# Patient Record
Sex: Female | Born: 1970 | Race: White | Hispanic: No | Smoking: Current some day smoker
Health system: Southern US, Community
[De-identification: ages and names within clinical notes are randomized; demographics above are authoritative.]

## PROBLEM LIST (undated history)

## (undated) DIAGNOSIS — IMO0002 Reserved for concepts with insufficient information to code with codable children: Secondary | ICD-10-CM

## (undated) DIAGNOSIS — J449 Chronic obstructive pulmonary disease, unspecified: Secondary | ICD-10-CM

## (undated) DIAGNOSIS — M329 Systemic lupus erythematosus, unspecified: Secondary | ICD-10-CM

## (undated) DIAGNOSIS — J45909 Unspecified asthma, uncomplicated: Secondary | ICD-10-CM

## (undated) HISTORY — PX: ABDOMINAL HYSTERECTOMY: SHX81

## (undated) HISTORY — PX: BACK SURGERY: SHX140

---

## 2015-03-01 DIAGNOSIS — K2960 Other gastritis without bleeding: Secondary | ICD-10-CM | POA: Diagnosis present

## 2019-04-10 ENCOUNTER — Other Ambulatory Visit: Payer: Self-pay

## 2019-04-10 ENCOUNTER — Emergency Department
Admission: EM | Admit: 2019-04-10 | Discharge: 2019-04-10 | Disposition: A | Discharge status: Home / Self Care | Payer: Medicare Other | Attending: Emergency Medicine | Admitting: Emergency Medicine

## 2019-04-10 ENCOUNTER — Emergency Department: Payer: Medicare Other

## 2019-04-10 ENCOUNTER — Encounter: Payer: Self-pay | Admitting: Emergency Medicine

## 2019-04-10 DIAGNOSIS — Z5321 Procedure and treatment not carried out due to patient leaving prior to being seen by health care provider: Secondary | ICD-10-CM | POA: Insufficient documentation

## 2019-04-10 DIAGNOSIS — R0602 Shortness of breath: Secondary | ICD-10-CM | POA: Diagnosis present

## 2019-04-10 HISTORY — DX: Chronic obstructive pulmonary disease, unspecified: J44.9

## 2019-04-10 HISTORY — DX: Reserved for concepts with insufficient information to code with codable children: IMO0002

## 2019-04-10 HISTORY — DX: Systemic lupus erythematosus, unspecified: M32.9

## 2019-04-10 HISTORY — DX: Unspecified asthma, uncomplicated: J45.909

## 2019-04-10 NOTE — ED Triage Notes (Signed)
Arrives via [facility].  PER EMS:  Patient began feeling SOB since Monday.  Sent home from work on [employer], waiting to hear back from PCP for appointment, inhaler, nebulizer, singulair without relief and worsening feeling of SOB>  Grandchildren with recent viral illness -- they tested negative for COVID.Marland Kitchen  EMS states patient initially wheezing and sob.  Duoneb given with improvement, cleared wheezing and work of breathing improved.  VS wnl.

## 2019-11-13 ENCOUNTER — Other Ambulatory Visit: Payer: Self-pay

## 2019-11-13 ENCOUNTER — Emergency Department
Admission: EM | Admit: 2019-11-13 | Discharge: 2019-11-14 | Disposition: A | Discharge status: Home / Self Care | Payer: Medicare Other | Attending: Emergency Medicine | Admitting: Emergency Medicine

## 2019-11-13 DIAGNOSIS — J449 Chronic obstructive pulmonary disease, unspecified: Secondary | ICD-10-CM | POA: Insufficient documentation

## 2019-11-13 DIAGNOSIS — T782XXA Anaphylactic shock, unspecified, initial encounter: Secondary | ICD-10-CM | POA: Diagnosis not present

## 2019-11-13 DIAGNOSIS — Z91040 Latex allergy status: Secondary | ICD-10-CM | POA: Insufficient documentation

## 2019-11-13 DIAGNOSIS — Z91010 Allergy to peanuts: Secondary | ICD-10-CM | POA: Diagnosis not present

## 2019-11-13 DIAGNOSIS — F17210 Nicotine dependence, cigarettes, uncomplicated: Secondary | ICD-10-CM | POA: Insufficient documentation

## 2019-11-13 DIAGNOSIS — R42 Dizziness and giddiness: Secondary | ICD-10-CM | POA: Diagnosis present

## 2019-11-13 MED ORDER — DIPHENHYDRAMINE HCL 50 MG/ML IJ SOLN: 50.0000 mg | Freq: Once | INTRAMUSCULAR | Status: DC

## 2019-11-13 MED ORDER — ONDANSETRON HCL 4 MG/2ML IJ SOLN
4.0000 mg | Freq: Once | INTRAMUSCULAR | Status: AC
  Administered 2019-11-13: 4 mg via INTRAVENOUS
  Filled 2019-11-13: qty 2

## 2019-11-13 MED ORDER — METHYLPREDNISOLONE SODIUM SUCC 125 MG IJ SOLR
125.0000 mg | Freq: Once | INTRAMUSCULAR | Status: AC
  Administered 2019-11-13: 125 mg via INTRAVENOUS
  Filled 2019-11-13: qty 2

## 2019-11-13 MED ORDER — IPRATROPIUM-ALBUTEROL 0.5-2.5 (3) MG/3ML IN SOLN
3.0000 mL | Freq: Once | RESPIRATORY_TRACT | Status: AC
  Administered 2019-11-13: 3 mL via RESPIRATORY_TRACT
  Filled 2019-11-13: qty 3

## 2019-11-13 NOTE — ED Triage Notes (Addendum)
Patient coming [facility] from home for near syncope and allergic reaction. Patient allergic to pickles and peanuts - patient unknowingly ate pickles on sandwich from restaurant at dinner this evening.   Patient had rash/hives during EMS transport that have now resolved.   Patient c/o SOB, chest discomfort, cough and nausea.   Patient given 4 mg zofran, epipen IM, pepcid IV, 4 baby aspirin, 50 mg benadryl IV, 1 duoneb, and 400 mL NaCl in transport.

## 2019-11-13 NOTE — ED Notes (Signed)
ED provider at bedside.

## 2019-11-13 NOTE — ED Provider Notes (Signed)
[facility] Emergency Department Provider Note  ____________________________________________  Time seen: Approximately 11:38 PM  I have reviewed the triage vital signs and the nursing notes.   HISTORY  Chief Complaint Allergic Reaction   HPI Grace King is a 49 y.o. female with a history of asthma, COPD, lupus who presents for evaluation after anaphylaxis.  Patient reports having allergies to cucumbers and pickles.  She went out to eat this evening.  She had a sandwich that mistakenly had pickles inside.  When she tasted she tried to spit it out but she felt like she swallowed some of it.  30 minutes after that she started feeling dizzy, chest tightness, shortness of breath.  She felt like she was going to pass out.  She lost balance and fell backwards hitting her head on the wall.  No LOC.  She is not on blood thinners.  She also had nausea but no vomiting or diarrhea.  She describes a diffuse pruritic rash.  No angioedema, no throat closing sensation.  When EMS arrived, they gave her an EpiPen, 4 mg of IV Zofran, IV Pepcid, aspirin, 50 mg of Benadryl, 1 DuoNeb, and 400 cc of fluids.   After all those medications were given, patient now complaining only of nausea and still itchy with a rash.  Denies chest pain or shortness of breath, throat closing sensation, angioedema.  Past Medical History:  Diagnosis Date  . Asthma   . COPD (chronic obstructive pulmonary disease) (HCC)   . Lupus [facility])     Past Surgical History:  Procedure Laterality Date  . ABDOMINAL HYSTERECTOMY    . BACK SURGERY      Prior to Admission medications   Medication Sig Start Date End Date Taking? Authorizing Provider  EPINEPHrine 0.3 mg/0.3 mL IJ SOAJ injection Inject 0.3 mLs (0.3 mg total) into the muscle as needed for anaphylaxis. 11/14/19   Nita Sickle, MD  famotidine (PEPCID) 20 MG tablet Take 1 tablet (20 mg total) by mouth daily for 7 days. 11/14/19 11/21/19  Nita Sickle, MD  predniSONE (DELTASONE) 20 MG tablet Take 3 tablets (60 mg total) by mouth daily for 4 days. 11/14/19 11/18/19  Nita Sickle, MD    Allergies Erythromycin, Latex, Other, Peanut-containing drug products, and Penicillins  No family history on file.  Social History Social History   Tobacco Use  . Smoking status: Current Some Day Smoker    Last attempt to quit: 04/09/2017    Years since quitting: 2.6  . Smokeless tobacco: Never Used  Substance Use Topics  . Alcohol use: Never  . Drug use: Not on file    Review of Systems  Constitutional: Negative for fever. + Lightheadedness Eyes: Negative for visual changes. ENT: Negative for sore throat. Neck: No neck pain  Cardiovascular: Negative for chest pain. Respiratory: + shortness of breath. Gastrointestinal: Negative for abdominal pain, vomiting or diarrhea. + nausea Genitourinary: Negative for dysuria. Musculoskeletal: Negative for back pain. Skin: + rash. Neurological: Negative for headaches, weakness or numbness. Psych: No SI or HI  ____________________________________________   PHYSICAL EXAM:  VITAL SIGNS: ED Triage Vitals  Enc Vitals Group     BP 11/13/19 2301 (!) 158/85     Pulse Rate 11/13/19 2301 (!) 104     Resp 11/13/19 2301 18     Temp 11/13/19 2301 99.9 F (37.7 C)     Temp src --      SpO2 11/13/19 2301 95 %     Weight 11/13/19 2302  112 lb (50.8 kg)     Height 11/13/19 2302 5\' 5"  (1.651 m)     Head Circumference --      Peak Flow --      Pain Score 11/13/19 2302 7     Pain Loc --      Pain Edu? --      Excl. in GC? --     Constitutional: Alert and oriented. Well appearing and in no apparent distress. HEENT:      Head: Normocephalic and atraumatic.         Eyes: Conjunctivae are normal. Sclera is non-icteric.       Mouth/Throat: Mucous membranes are moist.  No angioedema, uvula and tongue are normal with no swelling, no stridor      Neck: Supple with no signs of meningismus.  No  C-spine tenderness Cardiovascular: Tachycardic with regular rhythm Respiratory: Normal respiratory effort.  Significantly decreased air movement bilaterally with no wheezing Gastrointestinal: Soft, non tender. Musculoskeletal: Nontender with normal range of motion in all extremities. No edema, cyanosis, or erythema of extremities.  No T and L-spine tenderness. Neurologic: Normal speech and language. Face is symmetric. Moving all extremities. No gross focal neurologic deficits are appreciated. Skin: Skin is warm, dry and intact.  Minimal blanching erythematous rash on her upper extremities. Psychiatric: Mood and affect are normal. Speech and behavior are normal.  ____________________________________________   LABS (all labs ordered are listed, but only abnormal results are displayed)  Labs Reviewed - No data to display ____________________________________________  EKG  none  ____________________________________________  RADIOLOGY  I have personally reviewed the images performed during this visit and I agree with the Radiologist's read.   Interpretation by Radiologist:  CT Head Wo Contrast  Result Date: 11/14/2019 CLINICAL DATA:  Head trauma EXAM: CT HEAD WITHOUT CONTRAST TECHNIQUE: Contiguous axial images were obtained from the base of the skull through the vertex without intravenous contrast. COMPARISON:  None. FINDINGS: Brain: There is no mass, hemorrhage or extra-axial collection. The size and configuration of the ventricles and extra-axial CSF spaces are normal. The brain parenchyma is normal, without acute or chronic infarction. Vascular: No abnormal hyperdensity of the major intracranial arteries or dural venous sinuses. No intracranial atherosclerosis. Skull: The visualized skull base, calvarium and extracranial soft tissues are normal. Sinuses/Orbits: No fluid levels or advanced mucosal thickening of the visualized paranasal sinuses. No mastoid or middle ear effusion. The orbits  are normal. IMPRESSION: Normal head CT. Electronically Signed   By: 01/14/2020 M.D.   On: 11/14/2019 00:52      ____________________________________________   PROCEDURES  Procedure(s) performed:yes .1-3 Lead EKG Interpretation Performed by: 01/14/2020, MD Authorized by: Nita Sickle, MD     Interpretation: non-specific     ECG rate assessment: tachycardic     Rhythm: sinus tachycardia     Ectopy: none     Critical Care performed:  None ____________________________________________   INITIAL IMPRESSION / ASSESSMENT AND PLAN / ED COURSE   49 y.o. female with a history of asthma, COPD, lupus who presents for evaluation after anaphylaxis after accidentally eating pickles and a sandwich this evening.  Patient with rash, nausea, dizziness, and shortness of breath.  Received several medications per EMS and at this time is only complained of nausea and rash.  No chest pain or shortness of breath.  She is well-appearing with no signs of angioedema, no stridor, airways patent.  She is slightly tachycardic after receiving 1 DuoNeb and an EpiPen.  She has a mild  pruritic rash and decreased air movement bilaterally.  Will treat with duo neb.  Patient has already received IV Pepcid, EpiPen and IV Benadryl.  Will give IV steroids.  Will monitor closely for any recurrence of her symptoms.  Old medical records reviewed.  Patient placed on telemetry for close monitoring  _________________________ 2:42 AM on 11/14/2019 -----------------------------------------  Patient monitored for 4 hours post EpiPen with no recurrence of her symptoms.  Discussed indications and how to use an EpiPen.  Patient provided with a prescription.  Recommend to carry 1 with her at all times.  Will also prescribe Pepcid and steroids.  Discussed my standard return precautions and follow-up with her primary care doctor.  CT head was visualized by me with no evidence of intracranial injury, confirmed by  radiology.    _____________________________________________ Please note:  Patient was evaluated in Emergency Department today for the symptoms described in the history of present illness. Patient was evaluated in the context of the global COVID-19 pandemic, which necessitated consideration that the patient might be at risk for infection with the SARS-CoV-2 virus that causes COVID-19. Institutional protocols and algorithms that pertain to the evaluation of patients at risk for COVID-19 are in a state of rapid change based on information released by regulatory bodies including the CDC and federal and state organizations. These policies and algorithms were followed during the patient's care in the ED.  Some ED evaluations and interventions may be delayed as a result of limited staffing during the pandemic.   [place] Controlled Sub[place] reviewed by me. ____________________________________________   FINAL CLINICAL IMPRESSION(S) / ED DIAGNOSES   Final diagnoses:  Anaphylaxis, initial encounter      NEW MEDICATIONS STARTED DURING THIS VISIT:  ED Discharge Orders         Ordered    EPINEPHrine 0.3 mg/0.3 mL IJ SOAJ injection  As needed     Discontinue  Reprint     11/14/19 0242    famotidine (PEPCID) 20 MG tablet  Daily     Discontinue  Reprint     11/14/19 0242    predniSONE (DELTASONE) 20 MG tablet  Daily     Discontinue  Reprint     11/14/19 0242           Note:  This document was prepared using Dragon voice recognition software and may include unintentional dictation errors.    Alfred Levins, [place], MD 11/14/19 [phone]

## 2019-11-14 ENCOUNTER — Emergency Department: Payer: Medicare Other

## 2019-11-14 DIAGNOSIS — T782XXA Anaphylactic shock, unspecified, initial encounter: Secondary | ICD-10-CM | POA: Diagnosis not present

## 2019-11-14 MED ORDER — FAMOTIDINE 20 MG PO TABS: 20.0000 mg | ORAL_TABLET | Freq: Every day | ORAL | 0 refills | Status: DC

## 2019-11-14 MED ORDER — EPINEPHRINE 0.3 MG/0.3ML IJ SOAJ: 0.3000 mg | INTRAMUSCULAR | 1 refills | Status: DC | PRN

## 2019-11-14 MED ORDER — PREDNISONE 20 MG PO TABS: 60.0000 mg | ORAL_TABLET | Freq: Every day | ORAL | 0 refills | Status: AC

## 2019-11-14 NOTE — ED Notes (Signed)
ED provider at bedside.

## 2019-11-14 NOTE — ED Notes (Signed)
Reviewed discharge instructions, follow-up care, and prescriptions with patient. Patient verbalized understanding of all information reviewed. Patient stable, with no distress noted at this time.    

## 2019-12-28 ENCOUNTER — Ambulatory Visit
Admission: EM | Admit: 2019-12-28 | Discharge: 2019-12-28 | Disposition: A | Discharge status: Home / Self Care | Payer: Medicare Other | Attending: Family Medicine | Admitting: Family Medicine

## 2019-12-28 ENCOUNTER — Other Ambulatory Visit: Payer: Self-pay

## 2019-12-28 ENCOUNTER — Encounter: Payer: Self-pay | Admitting: Emergency Medicine

## 2019-12-28 DIAGNOSIS — R202 Paresthesia of skin: Secondary | ICD-10-CM

## 2019-12-28 DIAGNOSIS — S29012A Strain of muscle and tendon of back wall of thorax, initial encounter: Secondary | ICD-10-CM

## 2019-12-28 MED ORDER — PREDNISONE 10 MG PO TABS: ORAL_TABLET | ORAL | 0 refills | Status: DC

## 2019-12-28 MED ORDER — CYCLOBENZAPRINE HCL 10 MG PO TABS: 10.0000 mg | ORAL_TABLET | Freq: Three times a day (TID) | ORAL | 0 refills | Status: DC | PRN

## 2019-12-28 NOTE — Discharge Instructions (Signed)
Heat/ice, tylenol

## 2019-12-28 NOTE — ED Provider Notes (Signed)
[facility]    CSN: [id number] Arrival date & time: 12/28/19  1421      History   Chief Complaint Chief Complaint  Patient presents with  . Shoulder Pain  . Arm Pain  . Hand Pain    HPI Grace King is a 49 y.o. female.   49 yo female with a c/o left shoulder, arm and upper back pains for the past week, associated with occasional burning and tingling. Denies any falls or other traumatic injury. States up until last week she was doing work involving lifting and over head reaching. Denies any facial or lower extremity numbness, speech or swallowing problems.      Past Medical History:  Diagnosis Date  . Asthma   . COPD (chronic obstructive pulmonary disease) (HCC)   . Lupus (HCC)     There are no problems to display for this patient.   Past Surgical History:  Procedure Laterality Date  . ABDOMINAL HYSTERECTOMY    . BACK SURGERY      OB History   No obstetric history on file.      Home Medications    Prior to Admission medications   Medication Sig Start Date End Date Taking? Authorizing Provider  albuterol (PROVENTIL) (2.5 MG/3ML) 0.083% nebulizer solution Inhale into the lungs. 08/18/19 08/17/20 Yes [provider]  budesonide-formoterol (SYMBICORT) 160-4.5 MCG/ACT inhaler Inhale into the lungs. 08/18/19 08/17/20 Yes [provider]  diclofenac (VOLTAREN) 50 MG EC tablet Take by mouth. 06/02/19  Yes [provider]  estradiol (CLIMARA - DOSED IN MG/24 HR) 0.05 mg/24hr patch Place onto the skin. 10/02/18  Yes [provider]  montelukast (SINGULAIR) 10 MG tablet Take by mouth. 08/18/19 08/17/20 Yes [provider]  omeprazole (PRILOSEC) 40 MG capsule Take by mouth. 08/18/19 08/17/20 Yes [provider]  oxyCODONE-acetaminophen (PERCOCET/ROXICET) 5-325 MG tablet Take by mouth. 12/14/19 01/13/20 Yes [provider]  senna (SENOKOT) 8.6 MG tablet Take 1 tablet by mouth daily. 03/03/19 03/02/20 Yes  [provider]  traZODone (DESYREL) 100 MG tablet Take by mouth. 12/01/18  Yes [provider]  cyclobenzaprine (FLEXERIL) 10 MG tablet Take 1 tablet (10 mg total) by mouth 3 (three) times daily as needed for muscle spasms. 12/28/19   Payton Mccallum, MD  EPINEPHrine 0.3 mg/0.3 mL IJ SOAJ injection Inject 0.3 mLs (0.3 mg total) into the muscle as needed for anaphylaxis. 11/14/19   Nita Sickle, MD  famotidine (PEPCID) 20 MG tablet Take 1 tablet (20 mg total) by mouth daily for 7 days. 11/14/19 11/21/19  Nita Sickle, MD  predniSONE (DELTASONE) 10 MG tablet Start 60 mg po day one, then 50 mg po day two, taper by 10 mg daily until complete. 12/28/19   Payton Mccallum, MD    Family History History reviewed. No pertinent family history.  Social History Social History   Tobacco Use  . Smoking status: Current Some Day Smoker    Last attempt to quit: 04/09/2017    Years since quitting: 2.7  . Smokeless tobacco: Never Used  Substance Use Topics  . Alcohol use: Never  . Drug use: Never     Allergies   Erythromycin, Latex, Other, Peanut-containing drug products, and Penicillins   Review of Systems Review of Systems   Physical Exam Triage Vital Signs ED Triage Vitals  Enc Vitals Group     BP 12/28/19 1538 (!) 153/87     Pulse Rate 12/28/19 1538 71     Resp 12/28/19 1538 18  Temp 12/28/19 1538 98.6 F (37 C)     Temp Source 12/28/19 1538 Oral     SpO2 12/28/19 1538 97 %     Weight 12/28/19 1535 112 lb (50.8 kg)     Height 12/28/19 1535 5\' 5"  (1.651 m)     Head Circumference --      Peak Flow --      Pain Score 12/28/19 1535 9     Pain Loc --      Pain Edu? --      Excl. in GC? --    No data found.  Updated Vital Signs BP (!) 153/87 (BP Location: Right Arm)   Pulse 71   Temp 98.6 F (37 C) (Oral)   Resp 18   Ht 5\' 5"  (1.651 m)   Wt 50.8 kg   SpO2 97%   BMI 18.64 kg/m   Visual Acuity Right Eye Distance:   Left Eye Distance:   Bilateral  Distance:    Right Eye Near:   Left Eye Near:    Bilateral Near:     Physical Exam Vitals and nursing note reviewed.  Constitutional:      General: She is not in acute distress.    Appearance: She is not toxic-appearing or diaphoretic.  Musculoskeletal:     Left shoulder: Tenderness (over the deltoid) present. No swelling, deformity, effusion, laceration, bony tenderness or crepitus. Normal range of motion. Normal strength. Normal pulse.     Thoracic back: Spasms and tenderness (over the right deltoid muscle) present. No swelling, edema, deformity, signs of trauma, lacerations or bony tenderness. Normal range of motion. No scoliosis.     Comments: Left upper extremity neurovascularly intact; strength 5/5 UE equal bilaterally  Neurological:     Mental Status: She is alert.      UC Treatments / Results  Labs (all labs ordered are listed, but only abnormal results are displayed) Labs Reviewed - No data to display  EKG   Radiology No results found.  Procedures Procedures (including critical care time)  Medications Ordered in UC Medications - No data to display  Initial Impression / Assessment and Plan / UC Course  I have reviewed the triage vital signs and the nursing notes.  Pertinent labs & imaging results that were available during my care of the patient were reviewed by me and considered in my medical decision making (see chart for details).      Final Clinical Impressions(s) / UC Diagnoses   Final diagnoses:  Muscle strain of left upper back, initial encounter  Paresthesia of arm     Discharge Instructions     Heat/ice, tylenol    ED Prescriptions    Medication Sig Dispense Auth. Provider   predniSONE (DELTASONE) 10 MG tablet Start 60 mg po day one, then 50 mg po day two, taper by 10 mg daily until complete. 21 tablet 12/30/19, MD   cyclobenzaprine (FLEXERIL) 10 MG tablet Take 1 tablet (10 mg total) by mouth 3 (three) times daily as needed for  muscle spasms. 30 tablet , MD      1. diagnosis reviewed with patient 2. rx as per orders above; reviewed possible side effects, interactions, risks and benefits  3. Recommend supportive treatment as above 4. Follow-up prn if symptoms worsen or don't improve   PDMP not reviewed this encounter.   Payton Mccallum, MD 12/28/19 [phone]

## 2019-12-28 NOTE — ED Triage Notes (Signed)
Patient c/o left shoulder, arm and pain that started 1 week ago. She states he is having burning and tingling in her should. Also reports that 3 of her fingers are numb. Denies injury.

## 2020-02-27 ENCOUNTER — Other Ambulatory Visit: Payer: Self-pay

## 2020-02-27 ENCOUNTER — Emergency Department: Payer: Medicare Other

## 2020-02-27 ENCOUNTER — Inpatient Hospital Stay
Admission: EM | Admit: 2020-02-27 | Discharge: 2020-02-29 | DRG: 190 | Disposition: A | Discharge status: Home / Self Care | Payer: Medicare Other | Attending: Internal Medicine | Admitting: Internal Medicine

## 2020-02-27 DIAGNOSIS — D696 Thrombocytopenia, unspecified: Secondary | ICD-10-CM | POA: Diagnosis present

## 2020-02-27 DIAGNOSIS — M329 Systemic lupus erythematosus, unspecified: Secondary | ICD-10-CM | POA: Diagnosis present

## 2020-02-27 DIAGNOSIS — E785 Hyperlipidemia, unspecified: Secondary | ICD-10-CM | POA: Diagnosis present

## 2020-02-27 DIAGNOSIS — J9601 Acute respiratory failure with hypoxia: Secondary | ICD-10-CM | POA: Diagnosis present

## 2020-02-27 DIAGNOSIS — Z79890 Hormone replacement therapy (postmenopausal): Secondary | ICD-10-CM

## 2020-02-27 DIAGNOSIS — Z91010 Allergy to peanuts: Secondary | ICD-10-CM

## 2020-02-27 DIAGNOSIS — R0602 Shortness of breath: Secondary | ICD-10-CM | POA: Diagnosis not present

## 2020-02-27 DIAGNOSIS — K219 Gastro-esophageal reflux disease without esophagitis: Secondary | ICD-10-CM | POA: Diagnosis present

## 2020-02-27 DIAGNOSIS — M48061 Spinal stenosis, lumbar region without neurogenic claudication: Secondary | ICD-10-CM | POA: Diagnosis present

## 2020-02-27 DIAGNOSIS — J441 Chronic obstructive pulmonary disease with (acute) exacerbation: Principal | ICD-10-CM | POA: Diagnosis present

## 2020-02-27 DIAGNOSIS — J440 Chronic obstructive pulmonary disease with acute lower respiratory infection: Secondary | ICD-10-CM | POA: Diagnosis present

## 2020-02-27 DIAGNOSIS — F17210 Nicotine dependence, cigarettes, uncomplicated: Secondary | ICD-10-CM | POA: Diagnosis present

## 2020-02-27 DIAGNOSIS — Z79899 Other long term (current) drug therapy: Secondary | ICD-10-CM

## 2020-02-27 DIAGNOSIS — R739 Hyperglycemia, unspecified: Secondary | ICD-10-CM | POA: Diagnosis present

## 2020-02-27 DIAGNOSIS — Z79891 Long term (current) use of opiate analgesic: Secondary | ICD-10-CM

## 2020-02-27 DIAGNOSIS — J209 Acute bronchitis, unspecified: Secondary | ICD-10-CM | POA: Diagnosis present

## 2020-02-27 DIAGNOSIS — M5417 Radiculopathy, lumbosacral region: Secondary | ICD-10-CM | POA: Diagnosis present

## 2020-02-27 DIAGNOSIS — Z880 Allergy status to penicillin: Secondary | ICD-10-CM

## 2020-02-27 DIAGNOSIS — K2960 Other gastritis without bleeding: Secondary | ICD-10-CM | POA: Diagnosis present

## 2020-02-27 DIAGNOSIS — Z7951 Long term (current) use of inhaled steroids: Secondary | ICD-10-CM

## 2020-02-27 DIAGNOSIS — Z20822 Contact with and (suspected) exposure to covid-19: Secondary | ICD-10-CM | POA: Diagnosis present

## 2020-02-27 DIAGNOSIS — Z881 Allergy status to other antibiotic agents status: Secondary | ICD-10-CM

## 2020-02-27 DIAGNOSIS — Z90710 Acquired absence of both cervix and uterus: Secondary | ICD-10-CM

## 2020-02-27 DIAGNOSIS — Z91040 Latex allergy status: Secondary | ICD-10-CM

## 2020-02-27 DIAGNOSIS — K5909 Other constipation: Secondary | ICD-10-CM | POA: Diagnosis present

## 2020-02-27 LAB — BASIC METABOLIC PANEL
Anion gap: 12 (ref 5–15)
BUN: 9 mg/dL (ref 6–20)
CO2: 24 mmol/L (ref 22–32)
Calcium: 9.6 mg/dL (ref 8.9–10.3)
Chloride: 104 mmol/L (ref 98–111)
Creatinine, Ser: 0.7 mg/dL (ref 0.44–1.00)
GFR calc Af Amer: >60 mL/min (ref >60)
GFR calc non Af Amer: >60 mL/min (ref >60)
Glucose, Bld: 97 mg/dL (ref 70–99)
Potassium: 4.9 mmol/L (ref 3.5–5.1)
Sodium: 140 mmol/L (ref 135–145)

## 2020-02-27 LAB — CBC
HCT: 43.7 % (ref 36.0–46.0)
Hemoglobin: 15.1 g/dL — ABNORMAL HIGH (ref 12.0–15.0)
MCH: 31.3 pg (ref 26.0–34.0)
MCHC: 34.6 g/dL (ref 30.0–36.0)
MCV: 90.7 fL (ref 80.0–100.0)
Platelets: 165 10*3/uL (ref 150–400)
RBC: 4.82 MIL/uL (ref 3.87–5.11)
RDW: 12.8 % (ref 11.5–15.5)
WBC: 5.4 10*3/uL (ref 4.0–10.5)
nRBC: 0 % (ref 0.0–0.2)

## 2020-02-27 LAB — TROPONIN I (HIGH SENSITIVITY)
Troponin I (High Sensitivity): 3 ng/L (ref <18)
Troponin I (High Sensitivity): 4 ng/L (ref <18)

## 2020-02-27 MED ORDER — METHYLPREDNISOLONE SODIUM SUCC 125 MG IJ SOLR
125.0000 mg | Freq: Once | INTRAMUSCULAR | Status: AC
  Administered 2020-02-28: 125 mg via INTRAVENOUS
  Filled 2020-02-27: qty 2

## 2020-02-27 MED ORDER — IPRATROPIUM-ALBUTEROL 0.5-2.5 (3) MG/3ML IN SOLN
3.0000 mL | Freq: Once | RESPIRATORY_TRACT | Status: AC
  Administered 2020-02-28: 3 mL via RESPIRATORY_TRACT
  Filled 2020-02-27: qty 3

## 2020-02-27 MED ORDER — METHYLPREDNISOLONE SODIUM SUCC 125 MG IJ SOLR: 125.0000 mg | Freq: Once | INTRAMUSCULAR | Status: DC

## 2020-02-27 MED ORDER — IPRATROPIUM-ALBUTEROL 0.5-2.5 (3) MG/3ML IN SOLN
3.0000 mL | Freq: Once | RESPIRATORY_TRACT | Status: AC
  Administered 2020-02-27: 3 mL via RESPIRATORY_TRACT
  Filled 2020-02-27: qty 3

## 2020-02-27 MED ORDER — MAGNESIUM SULFATE 2 GM/50ML IV SOLN
2.0000 g | Freq: Once | INTRAVENOUS | Status: AC
  Administered 2020-02-28: 2 g via INTRAVENOUS
  Filled 2020-02-27: qty 50

## 2020-02-27 MED ORDER — PREDNISONE 20 MG PO TABS
60.0000 mg | ORAL_TABLET | Freq: Once | ORAL | Status: AC
  Administered 2020-02-27: 60 mg via ORAL
  Filled 2020-02-27: qty 3

## 2020-02-27 NOTE — ED Triage Notes (Signed)
Pt comes POV with sob for 4 days. Hx of bronchitis and copd flair ups with weather changes. Pt is a smoker.

## 2020-02-27 NOTE — ED Notes (Signed)
Unable to obtain IV for steroids. attemtped x2. Breathing treatment administered per jessup

## 2020-02-27 NOTE — ED Provider Notes (Signed)
[facility] Emergency Department Provider Note   ____________________________________________   First MD Initiated Contact with Patient 02/27/20 2337     (approximate)  I have reviewed the triage vital signs and the nursing notes.   HISTORY  Chief Complaint Shortness of Breath    HPI Grace King is a 49 y.o. female who presents to the ED from home with a chief complaint of shortness of breath.  Patient has a history of COPD not on chronic oxygen who reports a 4-day history of cough and shortness of breath.  Attributes it to the weather.  Denies fever, chest pain, abdominal pain, nausea, vomiting or diarrhea.  Denies sick contacts.       Past Medical History:  Diagnosis Date  . Asthma   . COPD (chronic obstructive pulmonary disease) (HCC)   . Lupus [facility])     Patient Active Problem List   Diagnosis Date Noted  . COPD exacerbation (HCC) 02/28/2020    Past Surgical History:  Procedure Laterality Date  . ABDOMINAL HYSTERECTOMY    . BACK SURGERY      Prior to Admission medications   Medication Sig Start Date End Date Taking? Authorizing Provider  albuterol (PROVENTIL) (2.5 MG/3ML) 0.083% nebulizer solution Inhale 2.5 mg into the lungs every 4 (four) hours as needed.  08/18/19 08/17/20 Yes [provider]  budesonide-formoterol (SYMBICORT) 160-4.5 MCG/ACT inhaler Inhale 2 puffs into the lungs daily.  08/18/19 08/17/20 Yes [provider]  cyclobenzaprine (FLEXERIL) 10 MG tablet Take 1 tablet (10 mg total) by mouth 3 (three) times daily as needed for muscle spasms. 12/28/19  Yes Conty, Pamala Hurry, MD  EPINEPHrine 0.3 mg/0.3 mL IJ SOAJ injection Inject 0.3 mLs (0.3 mg total) into the muscle as needed for anaphylaxis. 11/14/19  Yes Don Perking, [place], MD  famotidine (PEPCID) 20 MG tablet Take 1 tablet (20 mg total) by mouth daily for 7 days. 11/14/19 02/28/20 Yes Veronese, [place], MD  montelukast (SINGULAIR) 10 MG tablet Take 10 mg by mouth at  bedtime.  08/18/19 08/17/20 Yes [provider]  omeprazole (PRILOSEC) 40 MG capsule Take 40 mg by mouth daily.  08/18/19 08/17/20 Yes [provider]  oxyCODONE-acetaminophen (PERCOCET/ROXICET) 5-325 MG tablet Take 1 tablet by mouth every 6 (six) hours as needed. 02/12/20 03/13/20 Yes [provider]  diclofenac (VOLTAREN) 50 MG EC tablet Take by mouth. Patient not taking: Reported on 02/28/2020 06/02/19   [provider]  estradiol (CLIMARA - DOSED IN MG/24 HR) 0.05 mg/24hr patch Place onto the skin. Patient not taking: Reported on 02/28/2020 10/02/18   [provider]  predniSONE (DELTASONE) 10 MG tablet Start 60 mg po day one, then 50 mg po day two, taper by 10 mg daily until complete. Patient not taking: Reported on 02/28/2020 12/28/19   Payton Mccallum, MD  senna (SENOKOT) 8.6 MG tablet Take 1 tablet by mouth daily. Patient not taking: Reported on 02/28/2020 03/03/19 03/02/20  [provider]  traZODone (DESYREL) 100 MG tablet Take by mouth. Patient not taking: Reported on 02/28/2020 12/01/18   [provider]    Allergies Erythromycin, Latex, Other, Peanut-containing drug products, and Penicillins  History reviewed. No pertinent family history.  Social History Social History   Tobacco Use  . Smoking status: Current Some Day Smoker    Last attempt to quit: 04/09/2017    Years since quitting: 2.8  . Smokeless tobacco: Never Used  Substance Use Topics  . Alcohol use: Never  . Drug use: Never  Review of Systems  Constitutional: No fever/chills Eyes: No visual changes. ENT: No sore throat. Cardiovascular: Denies chest pain. Respiratory: Positive for cough and shortness of breath. Gastrointestinal: No abdominal pain.  No nausea, no vomiting.  No diarrhea.  No constipation. Genitourinary: Negative for dysuria. Musculoskeletal: Negative for back pain. Skin: Negative for rash. Neurological: Negative for headaches, focal  weakness or numbness.   ____________________________________________   PHYSICAL EXAM:  VITAL SIGNS: ED Triage Vitals [02/27/20 1704]  Enc Vitals Group     BP (!) 166/102     Pulse Rate 98     Resp (!) 25     Temp 98.3 F (36.8 C)     Temp Source Oral     SpO2 100 %     Weight 118 lb (53.5 kg)     Height 5\' 7"  (1.702 m)     Head Circumference      Peak Flow      Pain Score 0     Pain Loc      Pain Edu?      Excl. in GC?     Constitutional: Alert and oriented.  Chronically ill appearing and in moderate acute distress. Eyes: Conjunctivae are normal. PERRL. EOMI. Head: Atraumatic. Nose: No congestion/rhinnorhea. Mouth/Throat: Mucous membranes are mildly dry.   Neck: No stridor.   Cardiovascular: Normal rate, regular rhythm. Grossly normal heart sounds.  Good peripheral circulation. Respiratory: Increased respiratory effort.  Mild retractions. Lungs with scattered rhonchi and wheezing. Gastrointestinal: Soft and nontender. No distention. No abdominal bruits. No CVA tenderness. Musculoskeletal: No lower extremity tenderness nor edema.  No joint effusions. Neurologic:  Normal speech and language. No gross focal neurologic deficits are appreciated.  Skin:  Skin is warm, dry and intact. No rash noted. Psychiatric: Mood and affect are normal. Speech and behavior are normal.  ____________________________________________   LABS (all labs ordered are listed, but only abnormal results are displayed)  Labs Reviewed  CBC - Abnormal; Notable for the following components:      Result Value   Hemoglobin 15.1 (*)    All other components within normal limits  RESPIRATORY PANEL BY RT PCR (FLU A&B, COVID)  EXPECTORATED SPUTUM ASSESSMENT W REFEX TO RESP CULTURE  BASIC METABOLIC PANEL  HIV ANTIBODY (ROUTINE TESTING W REFLEX)  BASIC METABOLIC PANEL  CBC  POC URINE PREG, ED  TROPONIN I (HIGH SENSITIVITY)  TROPONIN I (HIGH SENSITIVITY)    ____________________________________________  EKG  ED ECG REPORT I, Yang Rack J, the attending physician, personally viewed and interpreted this ECG.   Date: 02/28/2020  EKG Time: 1704  Rate: 100  Rhythm: normal EKG, normal sinus rhythm  Axis: Normal  Intervals:none  ST&T Change: Nonspecific  ____________________________________________  RADIOLOGY  ED MD interpretation: COPD  Official radiology report(s): DG Chest 2 View  Result Date: 02/27/2020 CLINICAL DATA:  Shortness of breath.  History of COPD. EXAM: CHEST - 2 VIEW COMPARISON:  04/10/2019 FINDINGS: Lungs are chronically hyperinflated with upper lobe predominant emphysema. Again seen bulla in the right upper lobe. Mild biapical pleuroparenchymal scarring. The heart is normal in size. Normal mediastinal contours. No focal airspace disease, pleural effusion, pulmonary edema, or pneumothorax. No acute osseous abnormalities are seen. IMPRESSION: 1. No acute abnormality. 2. Chronic hyperinflation and emphysema consistent with COPD, unchanged from November 2020. Electronically Signed   By: December 2020 M.D.   On: 02/27/2020 18:17    ____________________________________________   PROCEDURES  Procedure(s) performed (including Critical Care):  .1-3 Lead EKG Interpretation Performed by: 02/29/2020  J, MD Authorized by: Irean Hong, MD     Interpretation: normal     ECG rate:  98   ECG rate assessment: normal     Rhythm: sinus rhythm     Ectopy: none     Conduction: normal   Comments:     Patient placed on cardiac monitor to evaluate for arrhythmia     ____________________________________________   INITIAL IMPRESSION / ASSESSMENT AND PLAN / ED COURSE  As part of my medical decision making, I reviewed the following data within the electronic MEDICAL RECORD NUMBER Nursing notes reviewed and incorporated, Labs reviewed, EKG interpreted, Old chart reviewed, Radiograph reviewed, Discussed with admitting physician and  Notes from prior ED visits        49 year old female with COPD presenting with exacerbation. Differential includes, but is not limited to, viral syndrome, bronchitis including COPD exacerbation, pneumonia, reactive airway disease including asthma, CHF including exacerbation with or without pulmonary/interstitial edema, pneumothorax, ACS, thoracic trauma, and pulmonary embolism.  Laboratory and imaging results unremarkable.  Covid swab pending.  Patient received oral prednisone and 2 duo nebs prior to being roomed.  She is tachypneic with retractions and rhonchi/wheezing.  Will administer IV Solu-Medrol, IV magnesium, another DuoNeb.  Will discuss with hospitalist services for admission.      ____________________________________________   FINAL CLINICAL IMPRESSION(S) / ED DIAGNOSES  Final diagnoses:  COPD exacerbation [facility])     ED Discharge Orders    None      *Please note:  Munirah Noto was evaluated in Emergency Department on 02/28/2020 for the symptoms described in the history of present illness. She was evaluated in the context of the global COVID-19 pandemic, which necessitated consideration that the patient might be at risk for infection with the SARS-CoV-2 virus that causes COVID-19. Institutional protocols and algorithms that pertain to the evaluation of patients at risk for COVID-19 are in a state of rapid change based on information released by regulatory bodies including the CDC and federal and state organizations. These policies and algorithms were followed during the patient's care in the ED.  Some ED evaluations and interventions may be delayed as a result of limited staffing during and the pandemic.*   Note:  This document was prepared using Dragon voice recognition software and may include unintentional dictation errors.   Irean Hong, MD 02/28/20 [phone]

## 2020-02-28 DIAGNOSIS — K2960 Other gastritis without bleeding: Secondary | ICD-10-CM | POA: Diagnosis present

## 2020-02-28 DIAGNOSIS — Z881 Allergy status to other antibiotic agents status: Secondary | ICD-10-CM | POA: Diagnosis not present

## 2020-02-28 DIAGNOSIS — K219 Gastro-esophageal reflux disease without esophagitis: Secondary | ICD-10-CM

## 2020-02-28 DIAGNOSIS — R739 Hyperglycemia, unspecified: Secondary | ICD-10-CM

## 2020-02-28 DIAGNOSIS — Z90710 Acquired absence of both cervix and uterus: Secondary | ICD-10-CM | POA: Diagnosis not present

## 2020-02-28 DIAGNOSIS — M48061 Spinal stenosis, lumbar region without neurogenic claudication: Secondary | ICD-10-CM | POA: Diagnosis present

## 2020-02-28 DIAGNOSIS — J441 Chronic obstructive pulmonary disease with (acute) exacerbation: Principal | ICD-10-CM

## 2020-02-28 DIAGNOSIS — K5909 Other constipation: Secondary | ICD-10-CM | POA: Diagnosis present

## 2020-02-28 DIAGNOSIS — Z91010 Allergy to peanuts: Secondary | ICD-10-CM | POA: Diagnosis not present

## 2020-02-28 DIAGNOSIS — M329 Systemic lupus erythematosus, unspecified: Secondary | ICD-10-CM | POA: Diagnosis present

## 2020-02-28 DIAGNOSIS — R0602 Shortness of breath: Secondary | ICD-10-CM | POA: Diagnosis present

## 2020-02-28 DIAGNOSIS — M5417 Radiculopathy, lumbosacral region: Secondary | ICD-10-CM | POA: Diagnosis present

## 2020-02-28 DIAGNOSIS — D72829 Elevated white blood cell count, unspecified: Secondary | ICD-10-CM | POA: Diagnosis not present

## 2020-02-28 DIAGNOSIS — Z91040 Latex allergy status: Secondary | ICD-10-CM | POA: Diagnosis not present

## 2020-02-28 DIAGNOSIS — D696 Thrombocytopenia, unspecified: Secondary | ICD-10-CM

## 2020-02-28 DIAGNOSIS — J440 Chronic obstructive pulmonary disease with acute lower respiratory infection: Secondary | ICD-10-CM | POA: Diagnosis present

## 2020-02-28 DIAGNOSIS — F17210 Nicotine dependence, cigarettes, uncomplicated: Secondary | ICD-10-CM | POA: Diagnosis present

## 2020-02-28 DIAGNOSIS — Z79891 Long term (current) use of opiate analgesic: Secondary | ICD-10-CM | POA: Diagnosis not present

## 2020-02-28 DIAGNOSIS — J9601 Acute respiratory failure with hypoxia: Secondary | ICD-10-CM | POA: Diagnosis present

## 2020-02-28 DIAGNOSIS — E785 Hyperlipidemia, unspecified: Secondary | ICD-10-CM | POA: Diagnosis present

## 2020-02-28 DIAGNOSIS — J209 Acute bronchitis, unspecified: Secondary | ICD-10-CM | POA: Diagnosis present

## 2020-02-28 DIAGNOSIS — Z880 Allergy status to penicillin: Secondary | ICD-10-CM | POA: Diagnosis not present

## 2020-02-28 DIAGNOSIS — Z79899 Other long term (current) drug therapy: Secondary | ICD-10-CM | POA: Diagnosis not present

## 2020-02-28 DIAGNOSIS — Z7951 Long term (current) use of inhaled steroids: Secondary | ICD-10-CM | POA: Diagnosis not present

## 2020-02-28 DIAGNOSIS — Z79890 Hormone replacement therapy (postmenopausal): Secondary | ICD-10-CM | POA: Diagnosis not present

## 2020-02-28 DIAGNOSIS — Z20822 Contact with and (suspected) exposure to covid-19: Secondary | ICD-10-CM | POA: Diagnosis present

## 2020-02-28 LAB — CBC
HCT: 41.7 % (ref 36.0–46.0)
Hemoglobin: 14 g/dL (ref 12.0–15.0)
MCH: 31.5 pg (ref 26.0–34.0)
MCHC: 33.6 g/dL (ref 30.0–36.0)
MCV: 93.7 fL (ref 80.0–100.0)
Platelets: 142 10*3/uL — ABNORMAL LOW (ref 150–400)
RBC: 4.45 MIL/uL (ref 3.87–5.11)
RDW: 12.9 % (ref 11.5–15.5)
WBC: 5.7 10*3/uL (ref 4.0–10.5)
nRBC: 0 % (ref 0.0–0.2)

## 2020-02-28 LAB — BASIC METABOLIC PANEL
Anion gap: 11 (ref 5–15)
BUN: 14 mg/dL (ref 6–20)
CO2: 24 mmol/L (ref 22–32)
Calcium: 9.2 mg/dL (ref 8.9–10.3)
Chloride: 103 mmol/L (ref 98–111)
Creatinine, Ser: 0.75 mg/dL (ref 0.44–1.00)
GFR calc Af Amer: >60 mL/min (ref >60)
GFR calc non Af Amer: >60 mL/min (ref >60)
Glucose, Bld: 185 mg/dL — ABNORMAL HIGH (ref 70–99)
Potassium: 3.5 mmol/L (ref 3.5–5.1)
Sodium: 138 mmol/L (ref 135–145)

## 2020-02-28 LAB — RESPIRATORY PANEL BY RT PCR (FLU A&B, COVID)
Influenza A by PCR: NEGATIVE
Influenza B by PCR: NEGATIVE
SARS Coronavirus 2 by RT PCR: NEGATIVE

## 2020-02-28 LAB — HIV ANTIBODY (ROUTINE TESTING W REFLEX): HIV Screen 4th Generation wRfx: NONREACTIVE

## 2020-02-28 MED ORDER — PANTOPRAZOLE SODIUM 40 MG PO TBEC
40.0000 mg | DELAYED_RELEASE_TABLET | Freq: Every day | ORAL | Status: DC
  Administered 2020-02-28 – 2020-02-29 (×2): 40 mg via ORAL
  Filled 2020-02-28 (×2): qty 1

## 2020-02-28 MED ORDER — METHYLPREDNISOLONE SODIUM SUCC 40 MG IJ SOLR
40.0000 mg | Freq: Three times a day (TID) | INTRAMUSCULAR | Status: AC
  Administered 2020-02-28 (×3): 40 mg via INTRAVENOUS
  Filled 2020-02-28 (×3): qty 1

## 2020-02-28 MED ORDER — IPRATROPIUM-ALBUTEROL 0.5-2.5 (3) MG/3ML IN SOLN
3.0000 mL | Freq: Four times a day (QID) | RESPIRATORY_TRACT | Status: DC
  Administered 2020-02-28 – 2020-02-29 (×5): 3 mL via RESPIRATORY_TRACT
  Filled 2020-02-28 (×6): qty 3

## 2020-02-28 MED ORDER — OXYCODONE-ACETAMINOPHEN 5-325 MG PO TABS
1.0000 | ORAL_TABLET | Freq: Four times a day (QID) | ORAL | Status: DC | PRN
  Administered 2020-02-28 – 2020-02-29 (×5): 1 via ORAL
  Filled 2020-02-28 (×5): qty 1

## 2020-02-28 MED ORDER — GUAIFENESIN ER 600 MG PO TB12
600.0000 mg | ORAL_TABLET | Freq: Two times a day (BID) | ORAL | Status: DC
  Filled 2020-02-28: qty 1

## 2020-02-28 MED ORDER — CYCLOBENZAPRINE HCL 10 MG PO TABS
10.0000 mg | ORAL_TABLET | Freq: Three times a day (TID) | ORAL | Status: DC | PRN
  Administered 2020-02-28: 10 mg via ORAL
  Filled 2020-02-28: qty 1

## 2020-02-28 MED ORDER — ACETAMINOPHEN 325 MG PO TABS: 650.0000 mg | ORAL_TABLET | Freq: Four times a day (QID) | ORAL | Status: DC | PRN

## 2020-02-28 MED ORDER — ESTRADIOL 0.05 MG/24HR TD PTWK: 0.0500 mg | MEDICATED_PATCH | TRANSDERMAL | Status: DC

## 2020-02-28 MED ORDER — MAGNESIUM HYDROXIDE 400 MG/5ML PO SUSP: 30.0000 mL | Freq: Every day | ORAL | Status: DC | PRN

## 2020-02-28 MED ORDER — ENOXAPARIN SODIUM 40 MG/0.4ML ~~LOC~~ SOLN
40.0000 mg | SUBCUTANEOUS | Status: DC
  Administered 2020-02-28 – 2020-02-29 (×2): 40 mg via SUBCUTANEOUS
  Filled 2020-02-28 (×2): qty 0.4

## 2020-02-28 MED ORDER — ONDANSETRON HCL 4 MG PO TABS: 4.0000 mg | ORAL_TABLET | Freq: Four times a day (QID) | ORAL | Status: DC | PRN

## 2020-02-28 MED ORDER — MONTELUKAST SODIUM 10 MG PO TABS
10.0000 mg | ORAL_TABLET | Freq: Every day | ORAL | Status: DC
  Administered 2020-02-28: 10 mg via ORAL
  Filled 2020-02-28: qty 1

## 2020-02-28 MED ORDER — GUAIFENESIN ER 600 MG PO TB12
1200.0000 mg | ORAL_TABLET | Freq: Two times a day (BID) | ORAL | Status: DC
  Administered 2020-02-28 – 2020-02-29 (×3): 1200 mg via ORAL
  Filled 2020-02-28 (×3): qty 2

## 2020-02-28 MED ORDER — HYDROCOD POLST-CPM POLST ER 10-8 MG/5ML PO SUER
5.0000 mL | Freq: Two times a day (BID) | ORAL | Status: DC | PRN
  Administered 2020-02-28 – 2020-02-29 (×2): 5 mL via ORAL
  Filled 2020-02-28 (×2): qty 5

## 2020-02-28 MED ORDER — ONDANSETRON HCL 4 MG/2ML IJ SOLN: 4.0000 mg | Freq: Four times a day (QID) | INTRAMUSCULAR | Status: DC | PRN

## 2020-02-28 MED ORDER — ACETAMINOPHEN 650 MG RE SUPP: 650.0000 mg | Freq: Four times a day (QID) | RECTAL | Status: DC | PRN

## 2020-02-28 MED ORDER — PREDNISONE 20 MG PO TABS
40.0000 mg | ORAL_TABLET | Freq: Every day | ORAL | Status: DC
  Administered 2020-02-29: 40 mg via ORAL
  Filled 2020-02-28: qty 2

## 2020-02-28 MED ORDER — POTASSIUM CHLORIDE CRYS ER 20 MEQ PO TBCR
40.0000 meq | EXTENDED_RELEASE_TABLET | Freq: Once | ORAL | Status: AC
  Administered 2020-02-28: 40 meq via ORAL
  Filled 2020-02-28: qty 2

## 2020-02-28 MED ORDER — SODIUM CHLORIDE 0.9 % IV SOLN: INTRAVENOUS | Status: DC

## 2020-02-28 MED ORDER — SODIUM CHLORIDE 0.9 % IV SOLN: INTRAVENOUS | Status: AC

## 2020-02-28 MED ORDER — METHYLPREDNISOLONE SODIUM SUCC 40 MG IJ SOLR: 40.0000 mg | Freq: Three times a day (TID) | INTRAMUSCULAR | Status: DC

## 2020-02-28 MED ORDER — FAMOTIDINE 20 MG PO TABS
20.0000 mg | ORAL_TABLET | Freq: Every day | ORAL | Status: DC
  Administered 2020-02-28 – 2020-02-29 (×2): 20 mg via ORAL
  Filled 2020-02-28 (×2): qty 1

## 2020-02-28 MED ORDER — PREDNISONE 20 MG PO TABS: 40.0000 mg | ORAL_TABLET | Freq: Every day | ORAL | Status: DC

## 2020-02-28 MED ORDER — SODIUM CHLORIDE 0.9 % IV SOLN
1.0000 g | INTRAVENOUS | Status: DC
  Administered 2020-02-28 – 2020-02-29 (×2): 1 g via INTRAVENOUS
  Filled 2020-02-28: qty 10
  Filled 2020-02-28: qty 1
  Filled 2020-02-28 (×2): qty 10

## 2020-02-28 MED ORDER — TRAZODONE HCL 50 MG PO TABS: 25.0000 mg | ORAL_TABLET | Freq: Every evening | ORAL | Status: DC | PRN

## 2020-02-28 MED ORDER — MOMETASONE FURO-FORMOTEROL FUM 200-5 MCG/ACT IN AERO
2.0000 | INHALATION_SPRAY | Freq: Two times a day (BID) | RESPIRATORY_TRACT | Status: DC
  Administered 2020-02-28 – 2020-02-29 (×2): 2 via RESPIRATORY_TRACT
  Filled 2020-02-28 (×2): qty 8.8

## 2020-02-28 MED ORDER — SENNA 8.6 MG PO TABS
1.0000 | ORAL_TABLET | Freq: Every day | ORAL | Status: DC
  Administered 2020-02-28 – 2020-02-29 (×2): 8.6 mg via ORAL
  Filled 2020-02-28 (×2): qty 1

## 2020-02-28 NOTE — Progress Notes (Signed)
Care started prior to midnight in the emergency room and patient admitted early this morning after midnight by Dr. Valente David and I am in agreement with his current assessment and plan.  Additional changes to the plan of care have been made accordingly.  The patient is a 49 year old thin Caucasian female with past medical history significant for but not limited to asthma as well as systemic lupus erythematosus, history of migraine headache, claustrophobia, history of erosive gastritis, vitamin D deficiency, dyslipidemia, as well as other comorbidities who presented with a chief complaint of acute onset worsening of dyspnea with associated cough and productive greenish sputum for the last 3 days.  She denies any fevers or chills or chest palpitations and she has been been vaccinated for COVID-19 disease back in July.  Upon presentation she is found to be found to have elevated blood pressure, and a two-view chest x-ray was done which was unchanged from November and showed no acute cardiopulmonary disease but did show chronic Constipation and emphysema consistent with COPD.  In the ED she is given 60 mg of p.o. prednisone and 2 DuoNeb treatments and because she is still having some respiratory distress and dyspnea as well as some intercostal muscle retraction she is given another nebulized treatment and given IV Solu-Medrol.  Currently she is being admitted and treated for the following but not limited to:  COPD and asthma acute exacerbation likely secondary to acute bronchitis. Acute respiratory failure with hypoxia in setting of above -The patient was admitted to a medical monitored bed. -Respiratory panel via PCR showed a negative influenza a and B as well as a negative SARS-CoV-2 testing -Will continue nebulizer therapy with DuoNeb scheduled and every 4 hours as needed. -Well continue steroid therapy with IV Solu-Medrol. Given 125 mg in the ED and transitioned to IV 40 mg q8h for 3 doses and then 40 mg po  Daily for 4 Days -AddED antibiotic therapy with IV Ceftriaxone but may consider changing to Doxycycline. -Mucolytic therapy will be provided with Guaifenesin 1200 mg po BID. -Sputum culture will be obtained. -Currently getting normal saline 100 MLS per hour but will change to 75 MLS per hour for another 12 more hours -We will hold Spiriva but continue Montelukast 10 mg po qHS -Will resume Symbicort to Hospital Formulary of Dulera -SpO2: 95 % O2 Flow Rate (L/min): 2 L/min  -Continuous pulse oximetry maintain O2 saturations greater than 90% Continue supplemental oxygen via nasal cannula and wean O2 as tolerated -Daily monitoring patient was respiratory status carefully and will need an ambulatory home O2 screen prior to discharge  GERD/Erosive Gastritis -C/w PPI Pantoprazole 40 mg po Daily and H2 blocker with Famotidine 20 mg po Daily   Dyslipidemia. -This is apparently diet managed.  History of migraine headaches. -She has been on as needed Imitrex.  Thrombocytopenia -Mild -Patient's Platelet Count went from 165 -> 142 -Continue to Monitor for S/Sx of Bleeding; Currently no overt bleeding noted -Repeat CBC in the AM   Hyperglycemia -Likely Reactive and in the setting of Steroid Demargination -Patient's Blood sugar went from 97 -> 185 -Check HbA1c -Continue to Monitor closely and if necessary will place on Sensitive Novolog SSI  Right worse than left lumbosacral radiculitis/central L3-L4 stenosis -Continue cyclobenzaprine as well as home oxycodone dose  SLE -Do not see that she is on any Medication on MAR  We will continue to monitor the patient's clinical response to intervention and repeat blood work and imaging in the a.m.  Status is:  Inpatient  Remains inpatient appropriate because:IV treatments appropriate due to intensity of illness or inability to take PO and Inpatient level of care appropriate due to severity of illness   Dispo: The patient is from: Home               Anticipated d/c is to: Home              Anticipated d/c date is: 1 day              Patient currently is not medically stable to d/c.

## 2020-02-28 NOTE — ED Notes (Signed)
Pt ambulated to bathroom at this time.

## 2020-02-28 NOTE — ED Notes (Signed)
Pt up to restroom independently without difficulty.  

## 2020-02-28 NOTE — ED Notes (Signed)
Pt placed on oxygen at 2lpm via [place] for pox of 91% on ra. Pt up independently to bedside commode to void.

## 2020-02-28 NOTE — H&P (Signed)
[place]   PATIENT NAME: Grace King    MR#:  [id number]  DATE OF BIRTH:  05/25/1971  DATE OF ADMISSION:  02/27/2020  PRIMARY CARE PHYSICIAN: Lesly Rubenstein, MD   REQUESTING/REFERRING PHYSICIAN: Chiquita Loth, MD  CHIEF COMPLAINT:   Chief Complaint  Patient presents with  . Shortness of Breath    HISTORY OF PRESENT ILLNESS:  Grace King  is a 49 y.o. Caucasian female with a known history of COPD/asthma and systemic lupus erythematosus, who presented to the emergency room with acute onset of worsening dyspnea with associated cough productive of greenish sputum and wheezing over the last 3 days. She denied any fever or chills.  No chest pain or palpitations.  She has been vaccinated for COVID-19 and her last dose was in July.  No headache or dizziness or blurred vision.  No nausea or vomiting or abdominal pain.  Upon presentation to the emergency room, blood pressure was 166/102 with respiratory rate of 25 otherwise normal vital signs.  Labs revealed a potassium 4.9 with otherwise normal BMP.  High-sensitivity troponin I was 3 and later 4.  CBC showed mild hemoconcentration.  Two-view chest x-ray showed chronic hyperinflation and emphysema consistent with COPD that is unchanged from November 2020 with no acute cardiopulmonary disease.  EKG showed sinus rhythm with short PR interval and a rate of of 100.  The patient was given 60 mg p.o. prednisone and 2 DuoNeb's.  She was still having significant respiratory distress, dyspnea a.m. intercostal muscle retractions.  She was given another nebulizer therapy and IV Solu-Medrol.  She will be admitted to a medical monitored bed for further evaluation and management. PAST MEDICAL HISTORY:   Past Medical History:  Diagnosis Date  . Asthma   . COPD (chronic obstructive pulmonary disease) (HCC)   . Lupus (HCC)   . Migraine headache  . Claustrophobia  . Easy bruising  . Right worse than left lumbosacral radiculitis (724.4)  . Erosive  gastritis  . Vitamin D deficiency  . 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  Dyslipidemia         .  Elevated blood pressure.  PAST SURGICAL HISTORY:   Past Surgical History:  Procedure Laterality Date  . ABDOMINAL HYSTERECTOMY    . BACK SURGERY      SOCIAL HISTORY:   Social History   Tobacco Use  . Smoking status: Current Some Day Smoker    Last attempt to quit: 04/09/2017    Years since quitting: 2.8  . Smokeless tobacco: Never Used  Substance Use Topics  . Alcohol use: Never    FAMILY HISTORY:  Her grandfather died from CVA and her father died from CHF.  History is otherwise positive for epilepsy in her grandmother.  Her mother died from breast cancer.  DRUG ALLERGIES:   Allergies  Allergen Reactions  . Erythromycin   . Latex   . Other     pickles  . Peanut-Containing Drug Products   . Penicillins     REVIEW OF SYSTEMS:   ROS As per history of present illness. All pertinent systems were reviewed above. Constitutional, HEENT, cardiovascular, respiratory, GI, GU, musculoskeletal, neuro, psychiatric, endocrine, integumentary and hematologic systems were reviewed and are otherwise negative/unremarkable except for positive findings mentioned above in the HPI.   MEDICATIONS AT HOME:   Prior to Admission medications   Medication Sig Start Date End Date Taking? Authorizing Provider  albuterol (PROVENTIL) (2.5 MG/3ML) 0.083% nebulizer solution Inhale into the lungs. 08/18/19 08/17/20  [provider]  budesonide-formoterol (SYMBICORT) 160-4.5 MCG/ACT inhaler Inhale into the lungs. 08/18/19 08/17/20  [provider]  cyclobenzaprine (FLEXERIL) 10 MG tablet Take 1 tablet (10 mg total) by mouth 3 (three) times daily as needed for muscle spasms. 12/28/19   Payton Mccallum, MD  diclofenac (VOLTAREN) 50 MG EC tablet Take by mouth. 06/02/19   [provider]  EPINEPHrine 0.3 mg/0.3 mL IJ SOAJ injection Inject 0.3 mLs (0.3 mg total) into the muscle as needed for  anaphylaxis. 11/14/19   Nita Sickle, MD  estradiol (CLIMARA - DOSED IN MG/24 HR) 0.05 mg/24hr patch Place onto the skin. 10/02/18   [provider]  famotidine (PEPCID) 20 MG tablet Take 1 tablet (20 mg total) by mouth daily for 7 days. 11/14/19 11/21/19  Nita Sickle, MD  montelukast (SINGULAIR) 10 MG tablet Take by mouth. 08/18/19 08/17/20  [provider]  omeprazole (PRILOSEC) 40 MG capsule Take by mouth. 08/18/19 08/17/20  [provider]  predniSONE (DELTASONE) 10 MG tablet Start 60 mg po day one, then 50 mg po day two, taper by 10 mg daily until complete. 12/28/19   Payton Mccallum, MD  senna (SENOKOT) 8.6 MG tablet Take 1 tablet by mouth daily. 03/03/19 03/02/20  [provider]  traZODone (DESYREL) 100 MG tablet Take by mouth. 12/01/18   [provider]      VITAL SIGNS:  Blood pressure (!) 166/102, pulse 98, temperature 98.3 F (36.8 C), temperature source Oral, resp. rate (!) 25, height 5\' 7"  (1.702 m), weight 53.5 kg, SpO2 100 %.  PHYSICAL EXAMINATION:  Physical Exam  GENERAL:  49 y.o.-year-old Caucasian female patient lying in the bed with mild to moderate respiratory stress with conversational dyspnea EYES: Pupils equal, round, reactive to light and accommodation. No scleral icterus. Extraocular muscles intact.  HEENT: Head atraumatic, normocephalic. Oropharynx and nasopharynx clear.  NECK:  Supple, no jugular venous distention. No thyroid enlargement, no tenderness.  LUNGS: Diffuse expiratory wheezes with tight expiratory airflow and harsh vesicular  breathing. CARDIOVASCULAR: Regular rate and rhythm, S1, S2 normal. No murmurs, rubs, or gallops.  ABDOMEN: Soft, nondistended, nontender. Bowel sounds present. No organomegaly or mass.  EXTREMITIES: No pedal edema, cyanosis, or clubbing.  NEUROLOGIC: Cranial nerves II through XII are intact. Muscle strength 5/5 in all extremities. Sensation intact. Gait not checked.  PSYCHIATRIC: The  patient is alert and oriented x 3.  Normal affect and good eye contact. SKIN: No obvious rash, lesion, or ulcer.   LABORATORY PANEL:   CBC Recent Labs  Lab 02/27/20 1715  WBC 5.4  HGB 15.1*  HCT 43.7  PLT 165   ------------------------------------------------------------------------------------------------------------------  Chemistries  Recent Labs  Lab 02/27/20 1715  NA 140  K 4.9  CL 104  CO2 24  GLUCOSE 97  BUN 9  CREATININE 0.70  CALCIUM 9.6   ------------------------------------------------------------------------------------------------------------------  Cardiac Enzymes No results for input(s): TROPONINI in the last 168 hours. ------------------------------------------------------------------------------------------------------------------  RADIOLOGY:  DG Chest 2 View  Result Date: 02/27/2020 CLINICAL DATA:  Shortness of breath.  History of COPD. EXAM: CHEST - 2 VIEW COMPARISON:  04/10/2019 FINDINGS: Lungs are chronically hyperinflated with upper lobe predominant emphysema. Again seen bulla in the right upper lobe. Mild biapical pleuroparenchymal scarring. The heart is normal in size. Normal mediastinal contours. No focal airspace disease, pleural effusion, pulmonary edema, or pneumothorax. No acute osseous abnormalities are seen. IMPRESSION: 1. No acute abnormality. 2. Chronic hyperinflation and emphysema consistent with COPD, unchanged from November 2020. Electronically Signed   By:  Narda Rutherford M.D.   On: 02/27/2020 18:17      IMPRESSION AND PLAN:   1.  COPD and asthma acute exacerbation likely secondary to acute bronchitis. -The patient was admitted to a medical monitored bed. -Will continue nebulizer therapy with DuoNeb scheduled and every 4 hours as needed. -Well continue steroid therapy with IV Solu-Medrol. -We will add antibiotic therapy with IV Rocephin. -Mucolytic therapy will be provided. -Sputum culture will be obtained. -We will continue  Spiriva and Singulair and hold off her Symbicort.  2.  GERD. -PPI therapy and H2 blocker therapy will be resumed.  3.  Dyslipidemia. -This is apparently diet managed.  4.  History of migraine headaches. -She has been on as needed Imitrex.  5.  DVT prophylaxis. -Subcutaneous Lovenox.  All the records are reviewed and case discussed with ED provider. The plan of care was discussed in details with the patient (and family). I answered all questions. The patient agreed to proceed with the above mentioned plan. Further management will depend upon hospital course.   CODE STATUS: Full code  Status is: Inpatient  Remains inpatient appropriate because:Ongoing diagnostic testing needed not appropriate for outpatient work up, Unsafe d/c plan, IV treatments appropriate due to intensity of illness or inability to take PO and Inpatient level of care appropriate due to severity of illness   Dispo: The patient is from: Home              Anticipated d/c is to: Home              Anticipated d/c date is: 2 days              Patient currently is not medically stable to d/c.   TOTAL TIME TAKING CARE OF THIS PATIENT: 55 minutes.    Hannah Beat M.D on 02/28/2020 at 12:21 AM  [facility]   From 7 PM-7 AM, contact night-coverage [email or web address]  CC: Primary care physician; Lesly Rubenstein, MD

## 2020-02-28 NOTE — ED Notes (Signed)
Pt is requesting home pain medication (percocet) for chronic pain. Pt states "she has not taken it in about 12 hours and is used to every 6".  Admitting provider messaged via secure chat.

## 2020-02-29 ENCOUNTER — Inpatient Hospital Stay: Payer: Medicare Other

## 2020-02-29 DIAGNOSIS — D72829 Elevated white blood cell count, unspecified: Secondary | ICD-10-CM

## 2020-02-29 DIAGNOSIS — J9601 Acute respiratory failure with hypoxia: Secondary | ICD-10-CM

## 2020-02-29 LAB — COMPREHENSIVE METABOLIC PANEL
ALT: 23 U/L (ref 0–44)
AST: 19 U/L (ref 15–41)
Albumin: 3.8 g/dL (ref 3.5–5.0)
Alkaline Phosphatase: 61 U/L (ref 38–126)
Anion gap: 13 (ref 5–15)
BUN: 16 mg/dL (ref 6–20)
CO2: 22 mmol/L (ref 22–32)
Calcium: 9.2 mg/dL (ref 8.9–10.3)
Chloride: 105 mmol/L (ref 98–111)
Creatinine, Ser: 0.66 mg/dL (ref 0.44–1.00)
GFR calc Af Amer: >60 mL/min (ref >60)
GFR calc non Af Amer: >60 mL/min (ref >60)
Glucose, Bld: 170 mg/dL — ABNORMAL HIGH (ref 70–99)
Potassium: 4.2 mmol/L (ref 3.5–5.1)
Sodium: 140 mmol/L (ref 135–145)
Total Bilirubin: 0.4 mg/dL (ref 0.3–1.2)
Total Protein: 6.6 g/dL (ref 6.5–8.1)

## 2020-02-29 LAB — CBC WITH DIFFERENTIAL/PLATELET
Abs Immature Granulocytes: 0.05 10*3/uL (ref 0.00–0.07)
Basophils Absolute: 0 10*3/uL (ref 0.0–0.1)
Basophils Relative: 0 %
Eosinophils Absolute: 0 10*3/uL (ref 0.0–0.5)
Eosinophils Relative: 0 %
HCT: 37.8 % (ref 36.0–46.0)
Hemoglobin: 13.3 g/dL (ref 12.0–15.0)
Immature Granulocytes: 0 %
Lymphocytes Relative: 6 %
Lymphs Abs: 0.7 10*3/uL (ref 0.7–4.0)
MCH: 32.1 pg (ref 26.0–34.0)
MCHC: 35.2 g/dL (ref 30.0–36.0)
MCV: 91.3 fL (ref 80.0–100.0)
Monocytes Absolute: 0.4 10*3/uL (ref 0.1–1.0)
Monocytes Relative: 4 %
Neutro Abs: 10 10*3/uL — ABNORMAL HIGH (ref 1.7–7.7)
Neutrophils Relative %: 90 %
Platelets: 145 10*3/uL — ABNORMAL LOW (ref 150–400)
RBC: 4.14 MIL/uL (ref 3.87–5.11)
RDW: 13 % (ref 11.5–15.5)
WBC: 11.2 10*3/uL — ABNORMAL HIGH (ref 4.0–10.5)
nRBC: 0 % (ref 0.0–0.2)

## 2020-02-29 LAB — MAGNESIUM: Magnesium: 2.1 mg/dL (ref 1.7–2.4)

## 2020-02-29 LAB — PHOSPHORUS: Phosphorus: 3.5 mg/dL (ref 2.5–4.6)

## 2020-02-29 MED ORDER — ALBUTEROL SULFATE (2.5 MG/3ML) 0.083% IN NEBU: 2.5000 mg | INHALATION_SOLUTION | RESPIRATORY_TRACT | 0 refills | Status: DC | PRN

## 2020-02-29 MED ORDER — GUAIFENESIN ER 600 MG PO TB12: 1200.0000 mg | ORAL_TABLET | Freq: Two times a day (BID) | ORAL | 0 refills | Status: AC

## 2020-02-29 MED ORDER — ACETAMINOPHEN 325 MG PO TABS: 650.0000 mg | ORAL_TABLET | Freq: Four times a day (QID) | ORAL | Status: DC | PRN

## 2020-02-29 MED ORDER — ACETAMINOPHEN 650 MG RE SUPP: 650.0000 mg | Freq: Four times a day (QID) | RECTAL | Status: DC | PRN

## 2020-02-29 MED ORDER — ONDANSETRON HCL 4 MG PO TABS: 4.0000 mg | ORAL_TABLET | Freq: Four times a day (QID) | ORAL | 0 refills | Status: DC | PRN

## 2020-02-29 MED ORDER — DOXYCYCLINE HYCLATE 100 MG PO TABS: 100.0000 mg | ORAL_TABLET | Freq: Two times a day (BID) | ORAL | 0 refills | Status: AC

## 2020-02-29 MED ORDER — FAMOTIDINE 20 MG PO TABS: 20.0000 mg | ORAL_TABLET | Freq: Every day | ORAL | 0 refills | Status: DC

## 2020-02-29 MED ORDER — INFLUENZA VAC SPLIT QUAD 0.5 ML IM SUSY: 0.5000 mL | PREFILLED_SYRINGE | INTRAMUSCULAR | Status: DC

## 2020-02-29 MED ORDER — PREDNISONE 10 MG (21) PO TBPK: ORAL_TABLET | ORAL | 0 refills | Status: DC

## 2020-02-29 MED ORDER — SENNOSIDES 8.6 MG PO TABS: 1.0000 | ORAL_TABLET | Freq: Every day | ORAL | 0 refills | Status: AC

## 2020-02-29 MED ORDER — PNEUMOCOCCAL VAC POLYVALENT 25 MCG/0.5ML IJ INJ: 0.5000 mL | INJECTION | INTRAMUSCULAR | Status: DC

## 2020-02-29 NOTE — Plan of Care (Signed)

## 2020-02-29 NOTE — Progress Notes (Signed)
Patient ambulated around the nurse station x 1. Room air she was between 94% to 96% Pulse between 90 -104

## 2020-02-29 NOTE — Discharge Summary (Signed)
Physician Discharge Summary  Grace King ZOX:[id number] DOB: 11-24-1970 DOA: 02/27/2020  PCP: Lesly Rubenstein, MD  Admit date: 02/27/2020 Discharge date: 02/29/2020  Admitted From: Home Disposition: Home  Recommendations for Outpatient Follow-up:  1. Follow up with PCP in 1-2 weeks 2. Follow up with Pulmonary Dr. Eliezer Mccoy within 1-2 weeks 3. Please obtain CMP/CBC, Mag, Phos in one week 4. Repeat CXR in 3-6 weeks 5. Please follow up on the following pending results:  Home Health: No  Equipment/Devices: None   Discharge Condition: Stable CODE STATUS: FULL CODE Diet recommendation: Heart Healthy   Brief/Interim Summary: The patient is a 49 year old thin Caucasian female with past medical history significant for but not limited to asthma as well as systemic lupus erythematosus, history of migraine headache, claustrophobia, history of erosive gastritis, vitamin D deficiency, dyslipidemia, as well as other comorbidities who presented with a chief complaint of acute onset worsening of dyspnea with associated cough and productive greenish sputum for the last 3 days.  She denies any fevers or chills or chest palpitations and she has been been vaccinated for COVID-19 disease back in July.  Upon presentation she is found to be found to have elevated blood pressure, and a two-view chest x-ray was done which was unchanged from November and showed no acute cardiopulmonary disease but did show chronic Constipation and emphysema consistent with COPD.  In the ED she is given 60 mg of p.o. prednisone and 2 DuoNeb treatments and because she is still having some respiratory distress and dyspnea as well as some intercostal muscle retraction she is given another nebulized treatment and given IV Solu-Medrol.  **Interim History Respiratory status improved significantly and she is weaned off her oxygen and ambulated without issues.  Her wheezing is much improved and patient is coughing up her sputum and feels much  better.  She feels close to baseline and was deemed stable for discharge and she will be transition from IV antibiotics to p.o. doxycycline and from IV Solu-Medrol to p.o. prednisone taper.  She will need to follow-up with her PCP and/or pulmonologist in outpatient setting.  We refilled her albuterol nebulizer she will continue her regular COPD medications.  Discharge Diagnoses:  Active Problems:   COPD exacerbation (HCC)   COPD and asthmaacute exacerbation likely secondary to acute bronchitis. Acute respiratory failure with hypoxia in setting of above, improved -The patient was admitted toamedical monitored bed. -Respiratory panel via PCR showed a negative influenza a and B as well as a negative SARS-CoV-2 testing -Will continue nebulizer therapy with DuoNeb scheduledandevery 4 hours as needed. -Well continue steroid therapy with IV Solu-Medrol. Given 125 mg in the ED and transitioned to IV 40 mg q8h for 3 doses and changed to po Sterapred taper at D/C  -Added antibiotic therapy with IV Ceftriaxone but will be changing to po Doxycycline at D/C for 4 more days for 5 days total  -Mucolytictherapy will be provided with Guaifenesin 1200 mg po BID for 5 more days -Sputum culture will be obtained but still pending to be done -IVF now stopped -We will hold Spirivabut continue Montelukast 10 mg po qHS; Resume Home Medications -Will resume Symbicort to [facility]; Can resume Home Symbicort and Albuterol Nebs at D/C -SpO2: 100 % O2 Flow Rate (L/min): 2 L/min; Weaned off of Supplemental O2 via [place] and did not desaturate on Ambulatory Home O2 Screen  -Continuous pulse oximetry maintain O2 saturations greater than 90% Continue supplemental oxygen via nasal cannula and wean O2 as tolerated -Continued to  Monitor respiratory status carefully and will need an ambulatory home O2 screen prior to discharge and she passed and does not need O2 -Repeat CXR in 3-6 weeks and follow up and  re-establish with Pulmonary in the outpatient setting  GERD/Erosive Gastritis -C/w PPI Pantoprazole 40 mg po Daily and H2 blocker with Famotidine 20 mg po Daily at D/C  Dyslipidemia. -This is apparently diet managed.  History of migraine headaches. -She has been on as needed Imitrex.  Thrombocytopenia -Mild -Patient's Platelet Count went from 165 -> 142 -> 145 -Continue to Monitor for S/Sx of Bleeding; Currently no overt bleeding noted -Repeat CBC within 1 week   Leukocytosis -In the setting of Steroid Demargination -Mild as WBC went from 5.7 -> 11.2 -Continue to Monitor and Trend in the outpatient setting -Repeat CBC within 1 week  Hyperglycemia -Likely Reactive and in the setting of Steroid Demargination -Patient's Blood sugar went from 97 on BMP -> 185 on CMP and was 170 this AM CMP  -Check HbA1c in the outpatient setting  -Continue to Monitor closely and if necessary will place on Sensitive Novolog SSI  Right worse than left lumbosacral radiculitis/central L3-L4 stenosis -Continue cyclobenzaprine as well as home oxycodone dose -Follow up with Neurosurgery in the outpatient setting  SLE -Do not see that she is [facility]  Discharge Instructions  Discharge Instructions    Call MD for:  difficulty breathing, headache or visual disturbances   Complete by: As directed    Call MD for:  extreme fatigue   Complete by: As directed    Call MD for:  hives   Complete by: As directed    Call MD for:  persistant dizziness or light-headedness   Complete by: As directed    Call MD for:  persistant nausea and vomiting   Complete by: As directed    Call MD for:  redness, tenderness, or signs of infection (pain, swelling, redness, odor or green/yellow discharge around incision site)   Complete by: As directed    Call MD for:  severe uncontrolled pain   Complete by: As directed    Call MD for:  temperature >100.4   Complete by: As directed    Diet - low  sodium heart healthy   Complete by: As directed    Discharge instructions   Complete by: As directed    You were cared for by a hospitalist during your hospital stay. If you have any questions about your discharge medications or the care you received while you were in the hospital after you are discharged, you can call the unit and ask to speak with the hospitalist on call if the hospitalist that took care of you is not available. Once you are discharged, your primary care physician will handle any further medical issues. Please note that NO REFILLS for any discharge medications will be authorized once you are discharged, as it is imperative that you return to your primary care physician (or establish a relationship with a primary care physician if you do not have one) for your aftercare needs so that they can reassess your need for medications and monitor your lab values.  Follow up with PCP and Pulmonary. Take all medications as prescribed. If symptoms change or worsen please return to the ED for evaluation   Increase activity slowly   Complete by: As directed      Allergies as of 02/29/2020      Reactions   Erythromycin    Latex  Other    pickles   Peanut-containing Drug Products    Penicillins       Medication List    STOP taking these medications   diclofenac 50 MG EC tablet Commonly known as: VOLTAREN   estradiol 0.05 mg/24hr patch Commonly known as: CLIMARA - Dosed in mg/24 hr   predniSONE 10 MG tablet Commonly known as: DELTASONE Replaced by: predniSONE 10 MG (21) Tbpk tablet   traZODone 100 MG tablet Commonly known as: DESYREL     TAKE these medications   albuterol (2.5 MG/3ML) 0.083% nebulizer solution Commonly known as: PROVENTIL Inhale 3 mLs (2.5 mg total) into the lungs every 4 (four) hours as needed.   budesonide-formoterol 160-4.5 MCG/ACT inhaler Commonly known as: SYMBICORT Inhale 2 puffs into the lungs daily.   cyclobenzaprine 10 MG tablet Commonly  known as: FLEXERIL Take 1 tablet (10 mg total) by mouth 3 (three) times daily as needed for muscle spasms.   doxycycline 100 MG tablet Commonly known as: VIBRA-TABS Take 1 tablet (100 mg total) by mouth 2 (two) times daily for 4 days.   EPINEPHrine 0.3 mg/0.3 mL Soaj injection Commonly known as: EPI-PEN Inject 0.3 mLs (0.3 mg total) into the muscle as needed for anaphylaxis.   famotidine 20 MG tablet Commonly known as: PEPCID Take 1 tablet (20 mg total) by mouth daily.   guaiFENesin 600 MG 12 hr tablet Commonly known as: MUCINEX Take 2 tablets (1,200 mg total) by mouth 2 (two) times daily for 5 days.   montelukast 10 MG tablet Commonly known as: SINGULAIR Take 10 mg by mouth at bedtime.   omeprazole 40 MG capsule Commonly known as: PRILOSEC Take 40 mg by mouth daily.   ondansetron 4 MG tablet Commonly known as: ZOFRAN Take 1 tablet (4 mg total) by mouth every 6 (six) hours as needed for nausea.   oxyCODONE-acetaminophen 5-325 MG tablet Commonly known as: PERCOCET/ROXICET Take 1 tablet by mouth every 6 (six) hours as needed.   predniSONE 10 MG (21) Tbpk tablet Commonly known as: STERAPRED UNI-PAK 21 TAB Take 6 pills on day 1, 5 pills on day 2, 4 pills on day 3, 3 pills on day 4, 2 pills on day 5, 1 pill on day 6, and then stop on day 7 Replaces: predniSONE 10 MG tablet   senna 8.6 MG tablet Commonly known as: SENOKOT Take 1 tablet (8.6 mg total) by mouth daily.       Follow-up Information    Lesly Rubenstein, MD. Call.   Specialty: Internal Medicine Why: Follow up within 1-2 weeks Contact information: [address] [place] [address] [phone]        Roque Lias, MD. Call.   Why: Follow up within 1-2 weeks Contact information: [address] [place] [address] [phone]              Allergies  Allergen Reactions  . Erythromycin   . Latex   . Other     pickles  . Peanut-Containing Drug Products   .  Penicillins    Consultations:  None  Procedures/Studies: DG Chest 1 View  Result Date: 02/29/2020 CLINICAL DATA:  Shortness of breath. EXAM: CHEST  1 VIEW COMPARISON:  02/27/2020. FINDINGS: Mediastinum and hilar structures normal. Stable bullous changes noted on the right. Stable pleural-parenchymal thickening consistent with scarring. No acute infiltrate. No pleural effusion or pneumothorax. Heart size normal. No acute bony abnormality. IMPRESSION: Stable bullous changes noted on the right. Stable right  pleuroparenchymal thickening consistent with scarring. No acute cardiopulmonary disease. Electronically Signed   By: Maisie Fus  Register   On: 02/29/2020 05:40   DG Chest 2 View  Result Date: 02/27/2020 CLINICAL DATA:  Shortness of breath.  History of COPD. EXAM: CHEST - 2 VIEW COMPARISON:  04/10/2019 FINDINGS: Lungs are chronically hyperinflated with upper lobe predominant emphysema. Again seen bulla in the right upper lobe. Mild biapical pleuroparenchymal scarring. The heart is normal in size. Normal mediastinal contours. No focal airspace disease, pleural effusion, pulmonary edema, or pneumothorax. No acute osseous abnormalities are seen. IMPRESSION: 1. No acute abnormality. 2. Chronic hyperinflation and emphysema consistent with COPD, unchanged from November 2020. Electronically Signed   By: Narda Rutherford M.D.   On: 02/27/2020 18:17     Subjective: And examined at bedside and she was much improved and she ambulated without issues and did not desaturate and was weaned off oxygen.  She felt as if she was at baseline and denies any other concerns or complaints.  She is stable to be discharged at this time and will need to follow-up with PCP and reestablish with pulmonary in outpatient setting.  She feels well and understands and agrees with the plan of care and she will be discharged home today.  Discharge Exam: Vitals:   02/29/20 0806 02/29/20 0913  BP:  (!) 146/69  Pulse:  71  Resp:  17   Temp:  97.6 F (36.4 C)  SpO2: 96% 100%   Vitals:   02/29/20 0105 02/29/20 0454 02/29/20 0806 02/29/20 0913  BP: (!) 150/94 (!) 152/90  (!) 146/69  Pulse: 84 87  71  Resp: 18 17  17   Temp: 97.8 F (36.6 C) 98.2 F (36.8 C)  97.6 F (36.4 C)  TempSrc: Oral Oral  Oral  SpO2: 96% 97% 96% 100%  Weight:      Height:       General: Pt is alert, awake, not in acute distress Cardiovascular: RRR, S1/S2 +, no rubs, no gallops Respiratory: Diminished bilaterally with very scant wheezing, no rhonchi; Wearing Supplemental O2 via [place] this AM but weaned off quickly  Abdominal: Soft, NT, ND, bowel sounds + Extremities: no edema, no cyanosis  The results of significant diagnostics from this hospitalization (including imaging, microbiology, ancillary and laboratory) are listed below for reference.    Microbiology: Recent Results (from the past 240 hour(s))  Respiratory Panel by RT PCR (Flu A&B, Covid) - Nasopharyngeal Swab     Status: None   Collection Time: 02/28/20 12:05 AM   Specimen: Nasopharyngeal Swab  Result Value Ref Range Status   SARS Coronavirus 2 by RT PCR NEGATIVE NEGATIVE Final    Comment: (NOTE) SARS-CoV-2 target nucleic acids are NOT DETECTED.  The SARS-CoV-2 RNA is generally detectable in upper respiratoy specimens during the acute phase of infection. The lowest concentration of SARS-CoV-2 viral copies this assay can detect is 131 copies/mL. A negative result does not preclude SARS-Cov-2 infection and should not be used as the sole basis for treatment or other patient management decisions. A negative result may occur with  improper specimen collection/handling, submission of specimen other than nasopharyngeal swab, presence of viral mutation(s) within the areas targeted by this assay, and inadequate number of viral copies (<131 copies/mL). A negative result must be combined with clinical observations, patient history, and epidemiological information. The expected result  is Negative.  Fact Sheet for Patients:  03/01/20  Fact Sheet for Healthcare Providers:  https://www.moore.com/  This test is no t yet approved  or cleared by the [place] and  has been authorized for detection and/or diagnosis of SARS-CoV-2 by FDA under an Emergency Use Authorization (EUA). This EUA will remain  in effect (meaning this test can be used) for the duration of the COVID-19 declaration under Section 564(b)(1) of the Act, 21 U.S.C. section 360bbb-3(b)(1), unless the authorization is terminated or revoked sooner.     Influenza A by PCR NEGATIVE NEGATIVE Final   Influenza B by PCR NEGATIVE NEGATIVE Final    Comment: (NOTE) The Xpert Xpress SARS-CoV-2/FLU/RSV assay is intended as an aid in  the diagnosis of influenza from Nasopharyngeal swab specimens and  should not be used as a sole basis for treatment. Nasal washings and  aspirates are unacceptable for Xpert Xpress SARS-CoV-2/FLU/RSV  testing.  Fact Sheet for Patients: https://www.moore.com/  Fact Sheet for Healthcare Providers: https://www.young.biz/  This test is not yet approved or cleared by the [place] FDA and  has been authorized for detection and/or diagnosis of SARS-CoV-2 by  FDA under an Emergency Use Authorization (EUA). This EUA will remain  in effect (meaning this test can be used) for the duration of the  Covid-19 declaration under Section 564(b)(1) of the Act, 21  U.S.C. section 360bbb-3(b)(1), unless the authorization is  terminated or revoked. Performed at [facility], [address] [place], [place] [address]     Labs: BNP (last 3 results) No results for input(s): BNP in the last 8760 hours. Basic Metabolic Panel: Recent Labs  Lab 02/27/20 1715 02/28/20 0418 02/29/20 0438  NA 140 138 140  K 4.9 3.5 4.2  CL 104 103 105  CO2 GLUCOSE 97 185* 170*  BUN CREATININE 0.70 0.75 0.66  CALCIUM 9.6 9.2 9.2  MG  --   --  2.1  PHOS  --   --  3.5   Liver Function Tests: Recent Labs  Lab 02/29/20 0438  AST 19  ALT 23  ALKPHOS 61  BILITOT 0.4  PROT 6.6  ALBUMIN 3.8   No results for input(s): LIPASE, AMYLASE in the last 168 hours. No results for input(s): AMMONIA in the last 168 hours. CBC: Recent Labs  Lab 02/27/20 1715 02/28/20 0418 02/29/20 0438  WBC 5.4 5.7 11.2*  NEUTROABS  --   --  10.0*  HGB 15.1* 14.0 13.3  HCT 43.7 41.7 37.8  MCV 90.7 93.7 91.3  PLT 165 142* 145*   Cardiac Enzymes: No results for input(s): CKTOTAL, CKMB, CKMBINDEX, TROPONINI in the last 168 hours. BNP: Invalid input(s): POCBNP CBG: No results for input(s): GLUCAP in the last 168 hours. D-Dimer No results for input(s): DDIMER in the last 72 hours. Hgb A1c No results for input(s): HGBA1C in the last 72 hours. Lipid Profile No results for input(s): CHOL, HDL, LDLCALC, TRIG, CHOLHDL, LDLDIRECT in the last 72 hours. Thyroid function studies No results for input(s): TSH, T4TOTAL, T3FREE, THYROIDAB in the last 72 hours.  Invalid input(s): FREET3 Anemia work up No results for input(s): VITAMINB12, FOLATE, FERRITIN, TIBC, IRON, RETICCTPCT in the last 72 hours. Urinalysis No results found for: COLORURINE, APPEARANCEUR, LABSPEC, PHURINE, GLUCOSEU, HGBUR, BILIRUBINUR, KETONESUR, PROTEINUR, UROBILINOGEN, NITRITE, LEUKOCYTESUR Sepsis Labs Invalid input(s): PROCALCITONIN,  WBC,  LACTICIDVEN Microbiology Recent Results (from the past 240 hour(s))  Respiratory Panel by RT PCR (Flu A&B, Covid) - Nasopharyngeal Swab     Status: None   Collection Time: 02/28/20 12:05 AM   Specimen: Nasopharyngeal Swab  Result Value Ref Range  Status   SARS Coronavirus 2 by RT PCR NEGATIVE NEGATIVE Final    Comment: (NOTE) SARS-CoV-2 target nucleic acids are NOT DETECTED.  The SARS-CoV-2 RNA is generally detectable in upper respiratoy specimens during the acute phase of  infection. The lowest concentration of SARS-CoV-2 viral copies this assay can detect is 131 copies/mL. A negative result does not preclude SARS-Cov-2 infection and should not be used as the sole basis for treatment or other patient management decisions. A negative result may occur with  improper specimen collection/handling, submission of specimen other than nasopharyngeal swab, presence of viral mutation(s) within the areas targeted by this assay, and inadequate number of viral copies (<131 copies/mL). A negative result must be combined with clinical observations, patient history, and epidemiological information. The expected result is Negative.  Fact Sheet for Patients:  https://www.moore.com/  Fact Sheet for Healthcare Providers:  https://www.young.biz/  This test is no t yet approved or cleared by the [place] FDA and  has been authorized for detection and/or diagnosis of SARS-CoV-2 by FDA under an Emergency Use Authorization (EUA). This EUA will remain  in effect (meaning this test can be used) for the duration of the COVID-19 declaration under Section 564(b)(1) of the Act, 21 U.S.C. section 360bbb-3(b)(1), unless the authorization is terminated or revoked sooner.     Influenza A by PCR NEGATIVE NEGATIVE Final   Influenza B by PCR NEGATIVE NEGATIVE Final    Comment: (NOTE) The Xpert Xpress SARS-CoV-2/FLU/RSV assay is intended as an aid in  the diagnosis of influenza from Nasopharyngeal swab specimens and  should not be used as a sole basis for treatment. Nasal washings and  aspirates are unacceptable for Xpert Xpress SARS-CoV-2/FLU/RSV  testing.  Fact Sheet for Patients: https://www.moore.com/  Fact Sheet for Healthcare Providers: https://www.young.biz/  This test is not yet approved or cleared by the [place] FDA and  has been authorized for detection and/or diagnosis of SARS-CoV-2  by  FDA under an Emergency Use Authorization (EUA). This EUA will remain  in effect (meaning this test can be used) for the duration of the  Covid-19 declaration under Section 564(b)(1) of the Act, 21  U.S.C. section 360bbb-3(b)(1), unless the authorization is  terminated or revoked. Performed at [facility], [address], [place], [place] [address]    Time coordinating discharge: 35 minutes  SIGNED:  Merlene Laughter, DO [facility] 02/29/2020, 12:06 PM Pager is on AMION  If 7PM-7AM, please contact night-coverage [email or web address]

## 2021-03-05 ENCOUNTER — Ambulatory Visit
Admission: EM | Admit: 2021-03-05 | Discharge: 2021-03-05 | Disposition: A | Discharge status: Home / Self Care | Payer: Medicaid Other | Attending: Physician Assistant | Admitting: Physician Assistant

## 2021-03-05 ENCOUNTER — Other Ambulatory Visit: Payer: Self-pay

## 2021-03-05 ENCOUNTER — Encounter: Payer: Self-pay | Admitting: Emergency Medicine

## 2021-03-05 DIAGNOSIS — R0602 Shortness of breath: Secondary | ICD-10-CM

## 2021-03-05 DIAGNOSIS — R051 Acute cough: Secondary | ICD-10-CM | POA: Diagnosis not present

## 2021-03-05 DIAGNOSIS — J441 Chronic obstructive pulmonary disease with (acute) exacerbation: Secondary | ICD-10-CM | POA: Diagnosis not present

## 2021-03-05 MED ORDER — METHYLPREDNISOLONE SODIUM SUCC 40 MG IJ SOLR
80.0000 mg | Freq: Once | INTRAMUSCULAR | Status: AC
  Administered 2021-03-05: 80 mg via INTRAMUSCULAR

## 2021-03-05 MED ORDER — METHYLPREDNISOLONE SODIUM SUCC 125 MG IJ SOLR
80.0000 mg | Freq: Once | INTRAMUSCULAR | Status: DC
Start: 2021-03-05 — End: 2021-03-05

## 2021-03-05 MED ORDER — DOXYCYCLINE HYCLATE 100 MG PO CAPS: 100.0000 mg | ORAL_CAPSULE | Freq: Two times a day (BID) | ORAL | 0 refills | Status: AC

## 2021-03-05 MED ORDER — PREDNISONE 20 MG PO TABS: 40.0000 mg | ORAL_TABLET | Freq: Every day | ORAL | 0 refills | Status: AC

## 2021-03-05 MED ORDER — CHERATUSSIN AC 100-10 MG/5ML PO SOLN: 5.0000 mL | Freq: Four times a day (QID) | ORAL | 0 refills | Status: DC | PRN

## 2021-03-05 NOTE — Discharge Instructions (Signed)
-  You are having a COPD exacerbation.  He has been given a steroid injection in the clinic today.  Start the oral prednisone tomorrow.  I have also sent an antibiotic.  Take full course.  Increase rest and fluid intake.  I have sent Cheratussin the pharmacy.  You state you have taken this in the past and has been helpful.  Need to make sure you are holding any chronic pain meds while taking the cough syrup. -You should be seen again if you develop a fever, worsening cough, increased chest pain or more breathing difficulty.  Make sure you are continuing to do your at home breathing treatments.

## 2021-03-05 NOTE — ED Triage Notes (Signed)
Patient c/o cough and SOB for over a week.  Patient states that she did a home covid test and was negative.  Patient denies fevers.  Patient reports history of asthma and COPD.

## 2021-03-05 NOTE — ED Provider Notes (Signed)
[facility]    CSN: [id number] Arrival date & time: 03/05/21  1128      History   Chief Complaint Chief Complaint  Patient presents with   Cough   Shortness of Breath   COPD    HPI Grace King is a 50 y.o. female with history of COPD, asthma and lupus presenting for 1 week history of productive cough and chest tightness as well as increased breathing difficulty.  Patient says she gets periodic exacerbations of COPD and has had severe episodes in the past requiring hospitalization.  Patient has been using her at home albuterol nebulizer every couple of hours.  She is also been using Symbicort and taking allergy medication.  Patient reports that she is afraid to take OTC cough meds because they sometimes make her asthma worse.  She says she did have a little bit of Cheratussin left over from an old prescription that she took but is now out.  She says that helps.  Patient says symptoms continue to worsen.  She denies any fevers.  She has not had any nasal congestion or sore throat.  No COVID exposure and she did have a negative COVID test.  No other complaints.  Of note, patient does take oxycodone as needed for chronic pain.  She says that she has any codeine continue cough syrup she holds her oxycodone.  HPI  Past Medical History:  Diagnosis Date   Asthma    COPD (chronic obstructive pulmonary disease) (HCC)    Lupus (HCC)     Patient Active Problem List   Diagnosis Date Noted   COPD exacerbation (HCC) 02/28/2020    Past Surgical History:  Procedure Laterality Date   ABDOMINAL HYSTERECTOMY     BACK SURGERY      OB History   No obstetric history on file.      Home Medications    Prior to Admission medications   Medication Sig Start Date End Date Taking? Authorizing Provider  albuterol (PROVENTIL) (2.5 MG/3ML) 0.083% nebulizer solution Inhale 3 mLs (2.5 mg total) into the lungs every 4 (four) hours as needed. 02/29/20 03/05/21 Yes Sheikh, Omair Latif, DO   budesonide-formoterol (SYMBICORT) 160-4.5 MCG/ACT inhaler Inhale 2 puffs into the lungs daily.  08/18/19 03/05/21 Yes [provider]  doxycycline (VIBRAMYCIN) 100 MG capsule Take 1 capsule (100 mg total) by mouth 2 (two) times daily for 5 days. 03/05/21 03/10/21 Yes Shirlee Latch, PA-C  famotidine (PEPCID) 20 MG tablet Take 1 tablet (20 mg total) by mouth daily. 02/29/20 03/05/21 Yes Sheikh, Omair Latif, DO  guaiFENesin-codeine (CHERATUSSIN AC) 100-10 MG/5ML syrup Take 5 mLs by mouth 4 (four) times daily as needed for cough. 03/05/21  Yes Eusebio Friendly B, PA-C  montelukast (SINGULAIR) 10 MG tablet Take 10 mg by mouth at bedtime.  08/18/19 03/05/21 Yes [provider]  omeprazole (PRILOSEC) 40 MG capsule Take 40 mg by mouth daily.  08/18/19 03/05/21 Yes [provider]  predniSONE (DELTASONE) 20 MG tablet Take 2 tablets (40 mg total) by mouth daily for 5 days. 03/05/21 03/10/21 Yes Shirlee Latch, PA-C  cyclobenzaprine (FLEXERIL) 10 MG tablet Take 1 tablet (10 mg total) by mouth 3 (three) times daily as needed for muscle spasms. 12/28/19   Payton Mccallum, MD  EPINEPHrine 0.3 mg/0.3 mL IJ SOAJ injection Inject 0.3 mLs (0.3 mg total) into the muscle as needed for anaphylaxis. 11/14/19   Nita Sickle, MD  ondansetron (ZOFRAN) 4 MG tablet Take 1 tablet (4 mg total) by  mouth every 6 (six) hours as needed for nausea. 02/29/20   Merlene Laughter, DO    Family History History reviewed. No pertinent family history.  Social History Social History   Tobacco Use   Smoking status: Some Days    Types: Cigarettes    Last attempt to quit: 04/09/2017    Years since quitting: 3.9   Smokeless tobacco: Never  Vaping Use   Vaping Use: Never used  Substance Use Topics   Alcohol use: Never   Drug use: Never     Allergies   Codeine, Cucumber extract, Diclofenac potassium, Duloxetine, Hydrocodone-acetaminophen, Oxycodone, Pregabalin, Shellfish allergy, Erythromycin, Latex, Morphine,  Other, Peanut-containing drug products, Penicillins, Ketorolac, and Tramadol   Review of Systems Review of Systems  Constitutional:  Positive for fatigue. Negative for chills, diaphoresis and fever.  HENT:  Positive for congestion. Negative for ear pain, rhinorrhea, sinus pressure, sinus pain and sore throat.   Respiratory:  Positive for cough, chest tightness and shortness of breath.   Gastrointestinal:  Negative for abdominal pain, nausea and vomiting.  Musculoskeletal:  Negative for arthralgias and myalgias.  Skin:  Negative for rash.  Neurological:  Negative for weakness and headaches.  Hematological:  Negative for adenopathy.    Physical Exam Triage Vital Signs ED Triage Vitals  Enc Vitals Group     BP 03/05/21 1156 (!) 164/95     Pulse Rate 03/05/21 1155 94     Resp 03/05/21 1155 14     Temp 03/05/21 1155 98.6 F (37 C)     Temp Source 03/05/21 1155 Oral     SpO2 03/05/21 1155 96 %     Weight 03/05/21 1151 110 lb (49.9 kg)     Height 03/05/21 1151 5\' 5"  (1.651 m)     Head Circumference --      Peak Flow --      Pain Score 03/05/21 1151 0     Pain Loc --      Pain Edu? --      Excl. in GC? --    No data found.  Updated Vital Signs BP (!) 164/95 (BP Location: Left Arm)   Pulse 94   Temp 98.6 F (37 C) (Oral)   Resp 16   Ht 5\' 5"  (1.651 m)   Wt 110 lb (49.9 kg)   SpO2 96%   BMI 18.30 kg/m      Physical Exam Vitals and nursing note reviewed.  Constitutional:      General: She is not in acute distress.    Appearance: Normal appearance. She is not ill-appearing or toxic-appearing.  HENT:     Head: Normocephalic and atraumatic.     Nose: No congestion.     Mouth/Throat:     Mouth: Mucous membranes are moist.     Pharynx: Oropharynx is clear.  Eyes:     General: No scleral icterus.       Right eye: No discharge.        Left eye: No discharge.     Conjunctiva/sclera: Conjunctivae normal.  Cardiovascular:     Rate and Rhythm: Normal rate and regular  rhythm.     Heart sounds: Normal heart sounds.  Pulmonary:     Effort: Pulmonary effort is normal. No respiratory distress.     Breath sounds: Normal breath sounds.     Comments: No wheezing, rhonchi or rales, but patient is not moving air well Musculoskeletal:     Cervical back: Neck supple.  Skin:  General: Skin is dry.  Neurological:     General: No focal deficit present.     Mental Status: She is alert. Mental status is at baseline.     Motor: No weakness.     Gait: Gait normal.  Psychiatric:        Mood and Affect: Mood normal.        Behavior: Behavior normal.        Thought Content: Thought content normal.     UC Treatments / Results  Labs (all labs ordered are listed, but only abnormal results are displayed) Labs Reviewed - No data to display  EKG   Radiology No results found.  Procedures Procedures (including critical care time)  Medications Ordered in UC Medications  methylPREDNISolone sodium succinate (SOLU-MEDROL) 40 mg/mL injection 80 mg (80 mg Intramuscular Given 03/05/21 1235)    Initial Impression / Assessment and Plan / UC Course  I have reviewed the triage vital signs and the nursing notes.  Pertinent labs & imaging results that were available during my care of the patient were reviewed by me and considered in my medical decision making (see chart for details).  50 year old female with history of COPD and asthma presenting for increased cough and congestion as well as shortness of breath and chest tightness for the past week.  Patient reports using albuterol a lot more frequently than she supposed to and taking her Symbicort more frequently than she is supposed to as well.  She is currently afebrile.  Oxygen is 96%.  She is in no acute respiratory distress.  Overall she looks well.  Chest is clear to auscultation but she is not moving air well.  Presentation consistent with COPD exacerbation.  Treating her at this time with doxycycline and prednisone.   She was given 80 mg Solu-Medrol IM in clinic.  Advised start prednisone tomorrow.  I also prescribed Cheratussin after reviewing controlled substance database.  Advised her to hold the oxycodone while taking the cough medication. I did discuss the risks of taking too much narcotic medication which can lead to respiratory depression and make things a lot worse.  She says she is always careful and will not take the oxycodone while taking the cough syrup.  Reviewed ED precautions with patient work note given.  Of note, patient had anaphylaxis listed as side effect to codeine.  I discussed this with her and she says she has never had a severe reaction to codeine before.  She says she has taken Cheratussin and other coding continued cough service multiple times in the last time was a few months ago.  Patient says she only has itching when she takes oral codeine.  I have changed this in her chart to show itching as a side effect.  Final Clinical Impressions(s) / UC Diagnoses   Final diagnoses:  COPD exacerbation (HCC)  Acute cough  Shortness of breath     Discharge Instructions      -You are having a COPD exacerbation.  He has been given a steroid injection in the clinic today.  Start the oral prednisone tomorrow.  I have also sent an antibiotic.  Take full course.  Increase rest and fluid intake.  I have sent Cheratussin the pharmacy.  You state you have taken this in the past and has been helpful.  Need to make sure you are holding any chronic pain meds while taking the cough syrup. -You should be seen again if you develop a fever, worsening cough, increased chest pain  or more breathing difficulty.  Make sure you are continuing to do your at home breathing treatments.     ED Prescriptions     Medication Sig Dispense Auth. Provider   doxycycline (VIBRAMYCIN) 100 MG capsule Take 1 capsule (100 mg total) by mouth 2 (two) times daily for 5 days. 10 capsule Eusebio Friendly B, PA-C   predniSONE  (DELTASONE) 20 MG tablet Take 2 tablets (40 mg total) by mouth daily for 5 days. 10 tablet Eusebio Friendly B, PA-C   guaiFENesin-codeine (CHERATUSSIN AC) 100-10 MG/5ML syrup Take 5 mLs by mouth 4 (four) times daily as needed for cough. 118 mL Shirlee Latch, PA-C      PDMP not reviewed this encounter.   Shirlee Latch, PA-C 03/05/21 1244

## 2021-04-03 ENCOUNTER — Emergency Department
Admission: EM | Admit: 2021-04-03 | Discharge: 2021-04-03 | Disposition: A | Discharge status: Home / Self Care | Payer: Medicare HMO | Attending: Emergency Medicine | Admitting: Emergency Medicine

## 2021-04-03 ENCOUNTER — Encounter: Payer: Self-pay | Admitting: Emergency Medicine

## 2021-04-03 ENCOUNTER — Other Ambulatory Visit: Payer: Self-pay

## 2021-04-03 DIAGNOSIS — Z91040 Latex allergy status: Secondary | ICD-10-CM | POA: Insufficient documentation

## 2021-04-03 DIAGNOSIS — Z7951 Long term (current) use of inhaled steroids: Secondary | ICD-10-CM | POA: Insufficient documentation

## 2021-04-03 DIAGNOSIS — J45909 Unspecified asthma, uncomplicated: Secondary | ICD-10-CM | POA: Diagnosis not present

## 2021-04-03 DIAGNOSIS — J441 Chronic obstructive pulmonary disease with (acute) exacerbation: Secondary | ICD-10-CM | POA: Insufficient documentation

## 2021-04-03 DIAGNOSIS — K0889 Other specified disorders of teeth and supporting structures: Secondary | ICD-10-CM | POA: Diagnosis present

## 2021-04-03 DIAGNOSIS — K047 Periapical abscess without sinus: Secondary | ICD-10-CM | POA: Insufficient documentation

## 2021-04-03 DIAGNOSIS — F17210 Nicotine dependence, cigarettes, uncomplicated: Secondary | ICD-10-CM | POA: Insufficient documentation

## 2021-04-03 DIAGNOSIS — Z91010 Allergy to peanuts: Secondary | ICD-10-CM | POA: Diagnosis not present

## 2021-04-03 MED ORDER — CLINDAMYCIN HCL 300 MG PO CAPS: 300.0000 mg | ORAL_CAPSULE | Freq: Three times a day (TID) | ORAL | 0 refills | Status: AC

## 2021-04-03 NOTE — ED Triage Notes (Signed)
First Nurse Note:  Had 8 teeth extracted. C/O upper jaw pain and possible infection

## 2021-04-03 NOTE — ED Provider Notes (Signed)
[facility] Emergency Department Provider Note ____________________________________________  Time seen: Approximately 11:49 AM  I have reviewed the triage vital signs and the nursing notes.   HISTORY  Chief Complaint No chief complaint on file.   HPI Grace King is a 50 y.o. female presents to the emergency department for treatment and evaluation of dental pain. She had 8 teeth extracted on Oct. 19. Everything has healed except one area. She awakened this morning with facial swelling. She states that she can "taste" the infection. She denies fever.   Past Medical History:  Diagnosis Date   Asthma    COPD (chronic obstructive pulmonary disease) (HCC)    Lupus (HCC)     Patient Active Problem List   Diagnosis Date Noted   COPD exacerbation (HCC) 02/28/2020    Past Surgical History:  Procedure Laterality Date   ABDOMINAL HYSTERECTOMY     BACK SURGERY      Prior to Admission medications   Medication Sig Start Date End Date Taking? Authorizing Provider  clindamycin (CLEOCIN) 300 MG capsule Take 1 capsule (300 mg total) by mouth 3 (three) times daily for 10 days. 04/03/21 04/13/21 Yes Marciel Offenberger B, FNP  albuterol (PROVENTIL) (2.5 MG/3ML) 0.083% nebulizer solution Inhale 3 mLs (2.5 mg total) into the lungs every 4 (four) hours as needed. 02/29/20 03/05/21  Marguerita Merles Latif, DO  budesonide-formoterol (SYMBICORT) 160-4.5 MCG/ACT inhaler Inhale 2 puffs into the lungs daily.  08/18/19 03/05/21  [provider]  cyclobenzaprine (FLEXERIL) 10 MG tablet Take 1 tablet (10 mg total) by mouth 3 (three) times daily as needed for muscle spasms. 12/28/19   Payton Mccallum, MD  EPINEPHrine 0.3 mg/0.3 mL IJ SOAJ injection Inject 0.3 mLs (0.3 mg total) into the muscle as needed for anaphylaxis. 11/14/19   Nita Sickle, MD  famotidine (PEPCID) 20 MG tablet Take 1 tablet (20 mg total) by mouth daily. 02/29/20 03/05/21  Marguerita Merles Latif, DO  guaiFENesin-codeine  (CHERATUSSIN AC) 100-10 MG/5ML syrup Take 5 mLs by mouth 4 (four) times daily as needed for cough. 03/05/21   Eusebio Friendly B, PA-C  montelukast (SINGULAIR) 10 MG tablet Take 10 mg by mouth at bedtime.  08/18/19 03/05/21  [provider]  omeprazole (PRILOSEC) 40 MG capsule Take 40 mg by mouth daily.  08/18/19 03/05/21  [provider]  ondansetron (ZOFRAN) 4 MG tablet Take 1 tablet (4 mg total) by mouth every 6 (six) hours as needed for nausea. 02/29/20   Marguerita Merles Latif, DO    Allergies Codeine, Cucumber extract, Diclofenac potassium, Duloxetine, Hydrocodone-acetaminophen, Oxycodone, Pregabalin, Shellfish allergy, Erythromycin, Latex, Morphine, Other, Peanut-containing drug products, Penicillins, Ketorolac, and Tramadol  No family history on file.  Social History Social History   Tobacco Use   Smoking status: Some Days    Types: Cigarettes    Last attempt to quit: 04/09/2017    Years since quitting: 3.9   Smokeless tobacco: Never  Vaping Use   Vaping Use: Never used  Substance Use Topics   Alcohol use: Never   Drug use: Never    Review of Systems Constitutional: Negative for fever or recent illness. ENT: Positive for dental pain. Musculoskeletal: Negative for trismus of the jaw.  Skin: Negative for wound or lesion. ____________________________________________   PHYSICAL EXAM:  VITAL SIGNS: ED Triage Vitals  Enc Vitals Group     BP 04/03/21 1042 (!) 165/90     Pulse Rate 04/03/21 1042 (!) 105     Resp 04/03/21 1042 20  Temp 04/03/21 1042 98.3 F (36.8 C)     Temp Source 04/03/21 1042 Oral     SpO2 04/03/21 1042 97 %     Weight 04/03/21 1006 110 lb 0.2 oz (49.9 kg)     Height 04/03/21 1006 5\' 5"  (1.651 m)     Head Circumference --      Peak Flow --      Pain Score 04/03/21 1006 7     Pain Loc --      Pain Edu? --      Excl. in GC? --     Constitutional: Alert and oriented. Well appearing and in no acute distress. Eyes: Conjunctiva are clear  without discharge or drainage. Mouth/Throat: Socket of tooth #11 open and with purulence noted. Periodontal Exam Hematological/Lymphatic/Immunilogical: No palpable adenopathy. Respiratory: Respirations even and unlabored. Musculoskeletal: Full ROM of the jaw. Neurologic: Awake, alert, oriented.  Skin:  Skin overlying area of dental pain on left upper lip/cheek swollen. Psychiatric: Affect and behavior intact.  ____________________________________________   LABS (all labs ordered are listed, but only abnormal results are displayed)  Labs Reviewed - No data to display ____________________________________________   RADIOLOGY  Not indicated. ____________________________________________   PROCEDURES  Procedure(s) performed:   Procedures  Critical Care performed: No ____________________________________________   INITIAL IMPRESSION / ASSESSMENT AND PLAN / ED COURSE  Grace King is a 50 y.o. female presenting to the emergency department for treatment and evaluation of dental pain.  See HPI for further details.  On exam, it does appear that she has abscess and open socket and what I believe was the location of tooth #11.  Plan will be to treat her with clindamycin since she has an allergy to penicillin.  She is a chronic pain patient and was prescribed oxycodone on October 27, therefore no additional pain medications written today. She is to follow up with her dentist next week as scheduled.  Pertinent labs & imaging results that were available during my care of the patient were reviewed by me and considered in my medical decision making (see chart for details).  ____________________________________________   FINAL CLINICAL IMPRESSION(S) / ED DIAGNOSES  Final diagnoses:  Abscess, dental    Discharge Medication List as of 04/03/2021 11:55 AM     START taking these medications   Details  clindamycin (CLEOCIN) 300 MG capsule Take 1 capsule (300 mg total) by mouth 3  (three) times daily for 10 days., Starting Mon 04/03/2021, Until Thu 04/13/2021, Normal        If controlled substance prescribed during this visit, 12 month history viewed on the [facility] prior to issuing an initial prescription for Schedule II or III opiod.  Note:  This document was prepared using Dragon voice recognition software and may include unintentional dictation errors.    13/03/2021, FNP 04/03/21 1513    04/05/21, MD 04/04/21 1457

## 2021-04-03 NOTE — Discharge Instructions (Signed)
Please take the antibiotic as prescribed and keep your appointment with your dentist as scheduled.  Rinse your mouth with warm salt water 4 times per day.  Because you have an active prescription for Percocet, no additional pain medications prescribed today. If you need to take more than prescribed, please contact the provider's office for permission.

## 2021-04-03 NOTE — ED Notes (Signed)
Pt states that she had multiple teeth removed from her top and bottom on oct 19th, pt states for the past week she has been having left upper facial swelling, states that she is in a lot of pain and states that she called her dentist back and they told her they could see her in a week, pt states that she was not prescribed any antibiotics and only prescribed 12 tramadol, pt states that there is an area in the left upper side of her mouth that was left open and states that she can see something white and she is concerned that it could be a bone Obvious swelling noted to the left side of her face

## 2021-04-24 ENCOUNTER — Emergency Department: Payer: Medicare HMO

## 2021-04-24 ENCOUNTER — Encounter: Payer: Self-pay | Admitting: Emergency Medicine

## 2021-04-24 ENCOUNTER — Inpatient Hospital Stay
Admission: EM | Admit: 2021-04-24 | Discharge: 2021-04-27 | DRG: 190 | Disposition: A | Discharge status: Home / Self Care | Payer: Medicare HMO | Attending: Internal Medicine | Admitting: Internal Medicine

## 2021-04-24 ENCOUNTER — Other Ambulatory Visit: Payer: Self-pay

## 2021-04-24 DIAGNOSIS — J209 Acute bronchitis, unspecified: Secondary | ICD-10-CM | POA: Diagnosis present

## 2021-04-24 DIAGNOSIS — Z79899 Other long term (current) drug therapy: Secondary | ICD-10-CM | POA: Diagnosis not present

## 2021-04-24 DIAGNOSIS — Z833 Family history of diabetes mellitus: Secondary | ICD-10-CM | POA: Diagnosis not present

## 2021-04-24 DIAGNOSIS — Z7951 Long term (current) use of inhaled steroids: Secondary | ICD-10-CM

## 2021-04-24 DIAGNOSIS — F17210 Nicotine dependence, cigarettes, uncomplicated: Secondary | ICD-10-CM | POA: Diagnosis present

## 2021-04-24 DIAGNOSIS — Z91013 Allergy to seafood: Secondary | ICD-10-CM | POA: Diagnosis not present

## 2021-04-24 DIAGNOSIS — Z880 Allergy status to penicillin: Secondary | ICD-10-CM

## 2021-04-24 DIAGNOSIS — Z8249 Family history of ischemic heart disease and other diseases of the circulatory system: Secondary | ICD-10-CM

## 2021-04-24 DIAGNOSIS — J441 Chronic obstructive pulmonary disease with (acute) exacerbation: Secondary | ICD-10-CM | POA: Diagnosis present

## 2021-04-24 DIAGNOSIS — Z885 Allergy status to narcotic agent status: Secondary | ICD-10-CM

## 2021-04-24 DIAGNOSIS — G43909 Migraine, unspecified, not intractable, without status migrainosus: Secondary | ICD-10-CM | POA: Diagnosis present

## 2021-04-24 DIAGNOSIS — J9601 Acute respiratory failure with hypoxia: Secondary | ICD-10-CM | POA: Diagnosis present

## 2021-04-24 DIAGNOSIS — Z91040 Latex allergy status: Secondary | ICD-10-CM | POA: Diagnosis not present

## 2021-04-24 DIAGNOSIS — Z888 Allergy status to other drugs, medicaments and biological substances status: Secondary | ICD-10-CM | POA: Diagnosis not present

## 2021-04-24 DIAGNOSIS — J439 Emphysema, unspecified: Principal | ICD-10-CM | POA: Diagnosis present

## 2021-04-24 DIAGNOSIS — K219 Gastro-esophageal reflux disease without esophagitis: Secondary | ICD-10-CM | POA: Diagnosis present

## 2021-04-24 DIAGNOSIS — Z20822 Contact with and (suspected) exposure to covid-19: Secondary | ICD-10-CM | POA: Diagnosis present

## 2021-04-24 DIAGNOSIS — Z23 Encounter for immunization: Secondary | ICD-10-CM | POA: Diagnosis present

## 2021-04-24 DIAGNOSIS — J440 Chronic obstructive pulmonary disease with acute lower respiratory infection: Secondary | ICD-10-CM

## 2021-04-24 DIAGNOSIS — Z823 Family history of stroke: Secondary | ICD-10-CM

## 2021-04-24 DIAGNOSIS — J45901 Unspecified asthma with (acute) exacerbation: Secondary | ICD-10-CM | POA: Diagnosis present

## 2021-04-24 DIAGNOSIS — R112 Nausea with vomiting, unspecified: Secondary | ICD-10-CM | POA: Diagnosis present

## 2021-04-24 LAB — CBC WITH DIFFERENTIAL/PLATELET
Abs Immature Granulocytes: 0.01 10*3/uL (ref 0.00–0.07)
Basophils Absolute: 0 10*3/uL (ref 0.0–0.1)
Basophils Relative: 0 %
Eosinophils Absolute: 0.1 10*3/uL (ref 0.0–0.5)
Eosinophils Relative: 1 %
HCT: 43.9 % (ref 36.0–46.0)
Hemoglobin: 14.7 g/dL (ref 12.0–15.0)
Immature Granulocytes: 0 %
Lymphocytes Relative: 22 %
Lymphs Abs: 0.9 10*3/uL (ref 0.7–4.0)
MCH: 31.4 pg (ref 26.0–34.0)
MCHC: 33.5 g/dL (ref 30.0–36.0)
MCV: 93.8 fL (ref 80.0–100.0)
Monocytes Absolute: 0.4 10*3/uL (ref 0.1–1.0)
Monocytes Relative: 9 %
Neutro Abs: 2.9 10*3/uL (ref 1.7–7.7)
Neutrophils Relative %: 68 %
Platelets: 119 10*3/uL — ABNORMAL LOW (ref 150–400)
RBC: 4.68 MIL/uL (ref 3.87–5.11)
RDW: 12.8 % (ref 11.5–15.5)
WBC: 4.2 10*3/uL (ref 4.0–10.5)
nRBC: 0 % (ref 0.0–0.2)

## 2021-04-24 LAB — COMPREHENSIVE METABOLIC PANEL
ALT: 20 U/L (ref 0–44)
AST: 22 U/L (ref 15–41)
Albumin: 3.8 g/dL (ref 3.5–5.0)
Alkaline Phosphatase: 55 U/L (ref 38–126)
Anion gap: 6 (ref 5–15)
BUN: 12 mg/dL (ref 6–20)
CO2: 27 mmol/L (ref 22–32)
Calcium: 8.7 mg/dL — ABNORMAL LOW (ref 8.9–10.3)
Chloride: 103 mmol/L (ref 98–111)
Creatinine, Ser: 0.7 mg/dL (ref 0.44–1.00)
GFR, Estimated: >60 mL/min (ref >60)
Glucose, Bld: 101 mg/dL — ABNORMAL HIGH (ref 70–99)
Potassium: 3.5 mmol/L (ref 3.5–5.1)
Sodium: 136 mmol/L (ref 135–145)
Total Bilirubin: 0.5 mg/dL (ref 0.3–1.2)
Total Protein: 6.7 g/dL (ref 6.5–8.1)

## 2021-04-24 LAB — TROPONIN I (HIGH SENSITIVITY)
Troponin I (High Sensitivity): 4 ng/L (ref <18)
Troponin I (High Sensitivity): 4 ng/L (ref <18)

## 2021-04-24 LAB — BRAIN NATRIURETIC PEPTIDE: B Natriuretic Peptide: 19.1 pg/mL (ref 0.0–100.0)

## 2021-04-24 LAB — RESP PANEL BY RT-PCR (FLU A&B, COVID) ARPGX2
Influenza A by PCR: NEGATIVE
Influenza B by PCR: NEGATIVE
SARS Coronavirus 2 by RT PCR: NEGATIVE

## 2021-04-24 LAB — D-DIMER, QUANTITATIVE: D-Dimer, Quant: 0.83 ug/mL-FEU — ABNORMAL HIGH (ref 0.00–0.50)

## 2021-04-24 MED ORDER — IPRATROPIUM-ALBUTEROL 0.5-2.5 (3) MG/3ML IN SOLN
3.0000 mL | Freq: Once | RESPIRATORY_TRACT | Status: AC
  Administered 2021-04-24: 3 mL via RESPIRATORY_TRACT
  Filled 2021-04-24: qty 3

## 2021-04-24 MED ORDER — IOHEXOL 350 MG/ML SOLN
75.0000 mL | Freq: Once | INTRAVENOUS | Status: AC | PRN
  Administered 2021-04-24: 75 mL via INTRAVENOUS

## 2021-04-24 MED ORDER — METHYLPREDNISOLONE SODIUM SUCC 125 MG IJ SOLR
125.0000 mg | INTRAMUSCULAR | Status: AC
  Administered 2021-04-24: 125 mg via INTRAVENOUS
  Filled 2021-04-24: qty 2

## 2021-04-24 MED ORDER — ALBUTEROL SULFATE (2.5 MG/3ML) 0.083% IN NEBU
7.5000 mg/h | INHALATION_SOLUTION | RESPIRATORY_TRACT | Status: AC
  Administered 2021-04-24: 7.5 mg/h via RESPIRATORY_TRACT
  Filled 2021-04-24: qty 20
  Filled 2021-04-24: qty 9

## 2021-04-24 NOTE — ED Notes (Addendum)
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 Pt refuses to use bed pan or a purewick kept for this pt. Requested to have  commode at bedside  __ wrong pt entry **

## 2021-04-24 NOTE — ED Triage Notes (Signed)
Pt reports for the past 3 days has has SOB, cough and some vomiting. Pt reports concerned she is having a COPD exacerbation. Pt denies acute pain, states has lupus so hurts all the time and nothing different.

## 2021-04-24 NOTE — ED Provider Notes (Signed)
Emergency Medicine Provider Triage Evaluation Note  Grace King , a 50 y.o. female  was evaluated in triage.  Pt complains of shortness of breath, cough and vomiting.  No fever.  States she has COPD does not know what her oxygen normally runs.  Feels like she is worsening.  Review of Systems  Positive: COPD exasperation, increased respiratory effort, cough and vomiting Negative: Fever, chills  Physical Exam  Ht 5\' 5"  (1.651 m)   Wt 51.7 kg   BMI 18.97 kg/m  Gen:   Awake, no distress   Resp:  Increased respiratory effort MSK:   Moves extremities without difficulty  Other:    Medical Decision Making  Medically screening exam initiated at 2:20 PM.  Appropriate orders placed.  Grace King was informed that the remainder of the evaluation will be completed by another provider, this initial triage assessment does not replace that evaluation, and the importance of remaining in the ED until their evaluation is complete.  Labs, imaging, Patton Salles, PA-C 04/24/21 1422    04/26/21, MD 04/24/21 [phone]

## 2021-04-24 NOTE — ED Notes (Signed)
Pt back to ct , O2 off and ambulated in room. MD at bedside and note pt w/ dyspnea w/exertion and hypoxia . O2 sat 85-88%.  Pt kept back to bed, O2 restaretd per nasal cannula. Albuterol Neb treatment resumed

## 2021-04-24 NOTE — ED Notes (Signed)
ERMD at bedside with the patient noted w? Dyspnea w/ exertion and desaturated w/ O2 sat 83-85% room air while talking to MD in room

## 2021-04-24 NOTE — H&P (Addendum)
[place]   PATIENT NAME: Grace King    MR#:  [id number]  DATE OF BIRTH:  August 05, 1970  DATE OF ADMISSION:  04/24/2021  PRIMARY CARE PHYSICIAN: Annice Needy, MD   Patient is coming from: Home  REQUESTING/REFERRING PHYSICIAN: Delman Kitten, MD  CHIEF COMPLAINT:   Chief Complaint  Patient presents with   Shortness of Breath   Cough   Emesis    HISTORY OF PRESENT ILLNESS:  Grace King is a 50 y.o. female with medical history significant for asthma and COPD as well as lupus , who presented to the emergency room with acute onset of worsening dyspnea with associated cough productive of greenish sputum and wheezing over the last week.  She admitted to nausea and vomiting with excessive cough.  No fever or chills.  No abdominal pain or melena or bright red bleeding per rectum.  She denies any bilious vomitus or hematemesis.  She denies any chest pain or palpitations.  No dysuria, oliguria or hematuria or flank pain.  ED Course: When she came to the ER blood pressure was 127/90 with heart rate of 118 with otherwise normal vital signs.  Pulsoxymeter is dropped to 84% to upon ambulation her pulse oximetry was normalized with 4 L of O2 by nasal cannula.  Labs revealed borderline potassium at 3.5 with otherwise unremarkable BMP.  CBC was within normal.  High-sensitivity troponin was 4 twice and BNP 19.1.  Follows antigens and COVID-19 PCR came back negative.  D-dimer was 0.83.  EKG as reviewed by me : Tachycardia with rate of 116 with right atrial enlargement and minimal voltage criteria for LVH.. Imaging: Chest CTA revealed emphysema without acute airspace disease or acute pulmonary embolus.  It showed aortic atherosclerosis.  The patient was given 3 duo nebs and nebulized albuterol as well as 125 mg of IV Solu-Medrol.  She will be admitted to a medical telemetry bed for further evaluation and management. PAST MEDICAL HISTORY:   Past Medical History:  Diagnosis Date   Asthma     COPD (chronic obstructive pulmonary disease) ([place])    Lupus ([place])     PAST SURGICAL HISTORY:   Past Surgical History:  Procedure Laterality Date   ABDOMINAL HYSTERECTOMY     BACK SURGERY      SOCIAL HISTORY:   Social History   Tobacco Use   Smoking status: Some Days    Types: Cigarettes    Last attempt to quit: 04/09/2017    Years since quitting: 4.0   Smokeless tobacco: Never  Substance Use Topics   Alcohol use: Never    FAMILY HISTORY:   Positive for diabetes mellitus, hypertension, CVA, cancer and coronary artery disease.  DRUG ALLERGIES:   Allergies  Allergen Reactions   Codeine Itching    Pills   Cucumber Extract Anaphylaxis    Pickles    Diclofenac Potassium Hives   Duloxetine Rash   Hydrocodone-Acetaminophen Anaphylaxis   Oxycodone Itching   Pregabalin Hives and Rash   Shellfish Allergy Anaphylaxis   Erythromycin    Latex    Morphine Itching    Patient itched after administering 4 mg morphine   Other     pickles   Peanut-Containing Drug Products    Penicillins    Ketorolac Rash   Tramadol Rash    REVIEW OF SYSTEMS:   ROS As per history of present illness. All pertinent systems were reviewed above. Constitutional, HEENT, cardiovascular, respiratory, GI, GU, musculoskeletal, neuro, psychiatric, endocrine, integumentary and hematologic systems  were reviewed and are otherwise negative/unremarkable except for positive findings mentioned above in the HPI.   MEDICATIONS AT HOME:   Prior to Admission medications   Medication Sig Start Date End Date Taking? Authorizing Provider  albuterol (PROVENTIL) (2.5 MG/3ML) 0.083% nebulizer solution Inhale 3 mLs (2.5 mg total) into the lungs every 4 (four) hours as needed. 02/29/20 03/05/21  Marguerita Merles Latif, DO  budesonide-formoterol (SYMBICORT) 160-4.5 MCG/ACT inhaler Inhale 2 puffs into the lungs daily.  08/18/19 03/05/21  [provider]  cyclobenzaprine (FLEXERIL) 10 MG tablet Take 1 tablet (10 mg  total) by mouth 3 (three) times daily as needed for muscle spasms. 12/28/19   Payton Mccallum, MD  EPINEPHrine 0.3 mg/0.3 mL IJ SOAJ injection Inject 0.3 mLs (0.3 mg total) into the muscle as needed for anaphylaxis. 11/14/19   Nita Sickle, MD  famotidine (PEPCID) 20 MG tablet Take 1 tablet (20 mg total) by mouth daily. 02/29/20 03/05/21  Marguerita Merles Latif, DO  guaiFENesin-codeine (CHERATUSSIN AC) 100-10 MG/5ML syrup Take 5 mLs by mouth 4 (four) times daily as needed for cough. 03/05/21   Eusebio Friendly B, PA-C  montelukast (SINGULAIR) 10 MG tablet Take 10 mg by mouth at bedtime.  08/18/19 03/05/21  [provider]  omeprazole (PRILOSEC) 40 MG capsule Take 40 mg by mouth daily.  08/18/19 03/05/21  [provider]  ondansetron (ZOFRAN) 4 MG tablet Take 1 tablet (4 mg total) by mouth every 6 (six) hours as needed for nausea. 02/29/20   Sheikh, Kateri Mc Latif, DO      VITAL SIGNS:  Blood pressure (!) 130/92, pulse (!) 101, temperature 98.4 F (36.9 C), temperature source Oral, resp. rate 20, height 5\' 5"  (1.651 m), weight 51.7 kg, SpO2 99 %.  PHYSICAL EXAMINATION:  Physical Exam  GENERAL:  50 y.o.-year-old female patient lying in the bed with no acute distress.  EYES: Pupils equal, round, reactive to light and accommodation. No scleral icterus. Extraocular muscles intact.  HEENT: Head atraumatic, normocephalic. Oropharynx and nasopharynx clear.  NECK:  Supple, no jugular venous distention. No thyroid enlargement, no tenderness.  LUNGS: Diffuse expiratory wheezes with tight expiratory airflow and harsh vesicular breathing.  No use of accessory muscles of respiration.  CARDIOVASCULAR: Regular rate and rhythm, S1, S2 normal. No murmurs, rubs, or gallops.  ABDOMEN: Soft, nondistended, nontender. Bowel sounds present. No organomegaly or mass.  EXTREMITIES: No pedal edema, cyanosis, or clubbing.  NEUROLOGIC: Cranial nerves II through XII are intact. Muscle strength 5/5 in all extremities.  Sensation intact. Gait not checked.  PSYCHIATRIC: The patient is alert and oriented x 3.  Normal affect and good eye contact. SKIN: No obvious rash, lesion, or ulcer.   LABORATORY PANEL:   CBC Recent Labs  Lab 04/24/21 1456  WBC 4.2  HGB 14.7  HCT 43.9  PLT 119*   ------------------------------------------------------------------------------------------------------------------  Chemistries  Recent Labs  Lab 04/24/21 1456  NA 136  K 3.5  CL 103  CO2 27  GLUCOSE 101*  BUN 12  CREATININE 0.70  CALCIUM 8.7*  AST 22  ALT 20  ALKPHOS 55  BILITOT 0.5   ------------------------------------------------------------------------------------------------------------------  Cardiac Enzymes No results for input(s): TROPONINI in the last 168 hours. ------------------------------------------------------------------------------------------------------------------  RADIOLOGY:  DG Chest 2 View  Result Date: 04/24/2021 CLINICAL DATA:  Cough and shortness of breath. EXAM: CHEST - 2 VIEW COMPARISON:  One-view chest x-ray 02/29/2020. Two-view chest x-ray 02/27/2020 FINDINGS: Changes of COPD again noted. Peripheral bulla lie in the right upper lobe are stable. No edema or  effusion is present. No focal airspace disease is present. Axial skeleton is within normal limits. IMPRESSION: 1. No acute cardiopulmonary disease or significant interval change. 2. Stable changes of COPD. Electronically Signed   By: San Morelle M.D.   On: 04/24/2021 14:55   CT Angio Chest PE W and/or Wo Contrast  Result Date: 04/24/2021 CLINICAL DATA:  Shortness of breath and cough EXAM: CT ANGIOGRAPHY CHEST WITH CONTRAST TECHNIQUE: Multidetector CT imaging of the chest was performed using the standard protocol during bolus administration of intravenous contrast. Multiplanar CT image reconstructions and MIPs were obtained to evaluate the vascular anatomy. CONTRAST:  98mL OMNIPAQUE IOHEXOL 350 MG/ML SOLN COMPARISON:   Chest x-ray 04/24/2021 FINDINGS: Cardiovascular: Satisfactory opacification of the pulmonary arteries to the segmental level. No evidence of pulmonary embolism. Normal heart size. No pericardial effusion. Nonaneurysmal aorta. No dissection is seen. Mild atherosclerosis. Mediastinum/Nodes: No enlarged mediastinal, hilar, or axillary lymph nodes. Thyroid gland, trachea, and esophagus demonstrate no significant findings. Lungs/Pleura: Advanced emphysema. No acute consolidation, pleural effusion or pneumothorax. Upper Abdomen: No acute abnormality. Musculoskeletal: No chest wall abnormality. No acute or significant osseous findings. Review of the MIP images confirms the above findings. IMPRESSION: 1. Negative for acute pulmonary embolus. 2. Emphysema without acute airspace disease Aortic Atherosclerosis (ICD10-I70.0) and Emphysema (ICD10-J43.9). Electronically Signed   By: Donavan Foil M.D.   On: 04/24/2021 22:32      IMPRESSION AND PLAN:  Principal Problem:   COPD exacerbation ([place])  1.  COPD and asthma acute exacerbation like secondary to acute bronchitis with subsequent acute hypoxic respiratory failure. - The patient will be admitted to a medical telemetry bed. - We will continue steroid therapy with IV Solu-Medrol. - We will continue bronchodilator therapy with DuoNebs 4 times daily and every 4 hours as needed. - We will place her on antibiotic therapy with IV Rocephin given severity of COPD exacerbation. - O2 protocol will be followed. - Mucolytic's will be provided. - We will hold off Symbicort and continue Spiriva. - We will continue Singulair.  2.  GERD. - We will continue PPI therapy and H2 blocker therapy.  3.  History of migraine. - No current flare.   DVT prophylaxis: Lovenox. Code Status: full code. Family Communication:  The plan of care was discussed in details with the patient (and family). I answered all questions. The patient agreed to proceed with the above mentioned  plan. Further management will depend upon hospital course. Disposition Plan: Back to previous home environment Consults called: none. All the records are reviewed and case discussed with ED provider.  Status is: Inpatient   Remains inpatient appropriate because:Ongoing diagnostic testing needed not appropriate for outpatient work up, Unsafe d/c plan, IV treatments appropriate due to intensity of illness or inability to take PO, and Inpatient level of care appropriate due to severity of illness   Dispo: The patient is from: Home              Anticipated d/c is to: Home              Patient currently is not medically stable to d/c.              Difficult to place patient: No  TOTAL TIME TAKING CARE OF THIS PATIENT: 55 minutes.     Christel Mormon M.D on 04/24/2021 at 11:04 PM  [facility]   From 7 PM-7 AM, contact night-coverage [email or web address]  CC: Primary care physician; Annice Needy, MD

## 2021-04-24 NOTE — ED Provider Notes (Signed)
[facility] Emergency Department Provider Note   ____________________________________________   Event Date/Time   First MD Initiated Contact with Patient 04/24/21 1937     (approximate)  I have reviewed the triage vital signs and the nursing notes.   HISTORY  Chief Complaint Shortness of Breath, Cough, and Emesis    HPI Grace King is a 50 y.o. female history of asthma COPD and lupus.  Not on any immune modulating medications  Reports has had several flareups of "COPD".  She is a smoker.  She recently had a COPD exacerbation was seen in urgent care she believes in the [place] area, stopped a prednisone taper which she completed about 4 days ago as well as approximately 7 days of doxycycline at that time.  She reports she is continued in the last 2 days to have ongoing wheezing shortness of breath particularly severe in the evenings, now to the point when she gets up even out of bed to walk back and forth the short distance to the bathroom she comes extremely short of breath with that or even talking  She continues to wheeze.  Slight dry cough.  No fevers or chills.  Symptoms seem to be getting worse once again were having some relief with previous treatment but again coming back  No leg swelling.  Slightly swollen in her hands over the last several days.  No chest pain but her whole chest feels very tight.  She reports when she lays down tries to breathe or walk she gets extreme sense of chest tightness like she can barely breathe  Past Medical History:  Diagnosis Date   Asthma    COPD (chronic obstructive pulmonary disease) ([facility])    Lupus ([facility])     Patient Active Problem List   Diagnosis Date Noted   COPD exacerbation ([facility]) 02/28/2020    Past Surgical History:  Procedure Laterality Date   ABDOMINAL HYSTERECTOMY     BACK SURGERY      Prior to Admission medications   Medication Sig Start Date End Date Taking? Authorizing Provider  albuterol  (PROVENTIL) (2.5 MG/3ML) 0.083% nebulizer solution Inhale 3 mLs (2.5 mg total) into the lungs every 4 (four) hours as needed. 02/29/20 03/05/21  Marguerita Merles Latif, DO  budesonide-formoterol (SYMBICORT) 160-4.5 MCG/ACT inhaler Inhale 2 puffs into the lungs daily.  08/18/19 03/05/21  [provider]  cyclobenzaprine (FLEXERIL) 10 MG tablet Take 1 tablet (10 mg total) by mouth 3 (three) times daily as needed for muscle spasms. 12/28/19   Payton Mccallum, MD  EPINEPHrine 0.3 mg/0.3 mL IJ SOAJ injection Inject 0.3 mLs (0.3 mg total) into the muscle as needed for anaphylaxis. 11/14/19   Nita Sickle, MD  famotidine (PEPCID) 20 MG tablet Take 1 tablet (20 mg total) by mouth daily. 02/29/20 03/05/21  Marguerita Merles Latif, DO  guaiFENesin-codeine (CHERATUSSIN AC) 100-10 MG/5ML syrup Take 5 mLs by mouth 4 (four) times daily as needed for cough. 03/05/21   Eusebio Friendly B, PA-C  montelukast (SINGULAIR) 10 MG tablet Take 10 mg by mouth at bedtime.  08/18/19 03/05/21  [provider]  omeprazole (PRILOSEC) 40 MG capsule Take 40 mg by mouth daily.  08/18/19 03/05/21  [provider]  ondansetron (ZOFRAN) 4 MG tablet Take 1 tablet (4 mg total) by mouth every 6 (six) hours as needed for nausea. 02/29/20   Marguerita Merles Latif, DO    Allergies Codeine, Cucumber extract, Diclofenac potassium, Duloxetine, Hydrocodone-acetaminophen, Oxycodone, Pregabalin, Shellfish allergy, Erythromycin, Latex, Morphine, Other, Peanut-containing  drug products, Penicillins, Ketorolac, and Tramadol  No family history on file.  Social History Social History   Tobacco Use   Smoking status: Some Days    Types: Cigarettes    Last attempt to quit: 04/09/2017    Years since quitting: 4.0   Smokeless tobacco: Never  Vaping Use   Vaping Use: Never used  Substance Use Topics   Alcohol use: Never   Drug use: Never    Review of Systems Constitutional: No fever/chills Eyes: No visual changes. ENT: No sore  throat. Cardiovascular: Denies chest pain.  Tightness positive Respiratory: The HPI Gastrointestinal: No abdominal pain.   Genitourinary: Negative for dysuria. Musculoskeletal: Negative for back pain.  Reports a distant history of a possible blood clot in her left leg but she does not think she ever required take any sort of blood thinner Skin: Negative for rash. Neurological: Negative for headaches, areas of focal weakness or numbness.    ____________________________________________   PHYSICAL EXAM:  VITAL SIGNS: ED Triage Vitals  Enc Vitals Group     BP 04/24/21 1422 127/90     Pulse Rate 04/24/21 1422 (!) 118     Resp 04/24/21 1422 20     Temp 04/24/21 1422 98.7 F (37.1 C)     Temp Source 04/24/21 1422 Oral     SpO2 04/24/21 1422 93 %     Weight 04/24/21 1415 114 lb (51.7 kg)     Height 04/24/21 1415 5\' 5"  (1.651 m)     Head Circumference --      Peak Flow --      Pain Score 04/24/21 1415 0     Pain Loc --      Pain Edu? --      Excl. in GC? --     Constitutional: Alert and oriented.  Sitting upright, not particularly tripoding but a somewhat upright position with legs crossed and some difficulty with breathing notable Eyes: Conjunctivae are normal. Head: Atraumatic. Nose: No congestion/rhinnorhea. Mouth/Throat: Mucous membranes are moist. Neck: No stridor.  Cardiovascular: Slightly tachycardic rate, regular rhythm. Grossly normal heart sounds.  Good peripheral circulation. Respiratory: Mildly tachypneic, sitting upright.  Accessory muscle use.  On room air while she speaks to me for about 20 seconds her oxygen saturation drops down to approximately 84% with good [place].  There is rebounds quickly with resting into the mid 90s.  Placed on 2 L oxygen nasal cannula at this time.  There is moderate end expiratory wheezing in lower lobes seem to have reported diminished lung movement bilaterally. Gastrointestinal: Soft and nontender. No distention. Musculoskeletal: No  lower extremity tenderness nor edema.  No venous cords or congestion bilateral.  No notable edema denoted. Neurologic:  Normal speech and language. No gross focal neurologic deficits are appreciated.  Skin:  Skin is warm, dry and intact. No rash noted. Psychiatric: Mood and affect are normal. Speech and behavior are normal.  ____________________________________________   LABS (all labs ordered are listed, but only abnormal results are displayed)  Labs Reviewed  COMPREHENSIVE METABOLIC PANEL - Abnormal; Notable for the following components:      Result Value   Glucose, Bld 101 (*)    Calcium 8.7 (*)    All other components within normal limits  CBC WITH DIFFERENTIAL/PLATELET - Abnormal; Notable for the following components:   Platelets 119 (*)    All other components within normal limits  D-DIMER, QUANTITATIVE - Abnormal; Notable for the following components:   D-Dimer, Quant 0.83 (*)  All other components within normal limits  RESP PANEL BY RT-PCR (FLU A&B, COVID) ARPGX2  BRAIN NATRIURETIC PEPTIDE  TROPONIN I (HIGH SENSITIVITY)  TROPONIN I (HIGH SENSITIVITY)   ____________________________________________  EKG  Reviewed inter by me at 1439 heart rate 115 QRS 99 QTc 440 Sinus tachycardia, suspect LVH.  Mild nonspecific T wave abnormality.  No obvious frank ischemia or STEMI but T wave abnormality is noted ____________________________________________  RADIOLOGY  DG Chest 2 View  Result Date: 04/24/2021 CLINICAL DATA:  Cough and shortness of breath. EXAM: CHEST - 2 VIEW COMPARISON:  One-view chest x-ray 02/29/2020. Two-view chest x-ray 02/27/2020 FINDINGS: Changes of COPD again noted. Peripheral bulla lie in the right upper lobe are stable. No edema or effusion is present. No focal airspace disease is present. Axial skeleton is within normal limits. IMPRESSION: 1. No acute cardiopulmonary disease or significant interval change. 2. Stable changes of COPD. Electronically Signed    By: Marin Roberts M.D.   On: 04/24/2021 14:55   CT Angio Chest PE W and/or Wo Contrast  Result Date: 04/24/2021 CLINICAL DATA:  Shortness of breath and cough EXAM: CT ANGIOGRAPHY CHEST WITH CONTRAST TECHNIQUE: Multidetector CT imaging of the chest was performed using the standard protocol during bolus administration of intravenous contrast. Multiplanar CT image reconstructions and MIPs were obtained to evaluate the vascular anatomy. CONTRAST:  9mL OMNIPAQUE IOHEXOL 350 MG/ML SOLN COMPARISON:  Chest x-ray 04/24/2021 FINDINGS: Cardiovascular: Satisfactory opacification of the pulmonary arteries to the segmental level. No evidence of pulmonary embolism. Normal heart size. No pericardial effusion. Nonaneurysmal aorta. No dissection is seen. Mild atherosclerosis. Mediastinum/Nodes: No enlarged mediastinal, hilar, or axillary lymph nodes. Thyroid gland, trachea, and esophagus demonstrate no significant findings. Lungs/Pleura: Advanced emphysema. No acute consolidation, pleural effusion or pneumothorax. Upper Abdomen: No acute abnormality. Musculoskeletal: No chest wall abnormality. No acute or significant osseous findings. Review of the MIP images confirms the above findings. IMPRESSION: 1. Negative for acute pulmonary embolus. 2. Emphysema without acute airspace disease Aortic Atherosclerosis (ICD10-I70.0) and Emphysema (ICD10-J43.9). Electronically Signed   By: Jasmine Pang M.D.   On: 04/24/2021 22:32    Chest x-ray negative for acute finding.  CT angiography reviewed negative for PE.  Changes evident of emphysema ____________________________________________   PROCEDURES  Procedure(s) performed: None  Procedures  Critical Care performed: Yes, see critical care note(s)  CRITICAL CARE Performed by: Sharyn Creamer   Total critical care time: 30 minutes  Critical care time was exclusive of separately billable procedures and treating other patients.  Critical care was necessary to treat or  prevent imminent or life-threatening deterioration.  Critical care was time spent personally by me on the following activities: development of treatment plan with patient and/or surrogate as well as nursing, discussions with consultants, evaluation of patient's response to treatment, examination of patient, obtaining history from patient or surrogate, ordering and performing treatments and interventions, ordering and review of laboratory studies, ordering and review of radiographic studies, pulse oximetry and re-evaluation of patient's condition.  ____________________________________________   INITIAL IMPRESSION / ASSESSMENT AND PLAN / ED COURSE  Pertinent labs & imaging results that were available during my care of the patient were reviewed by me and considered in my medical decision making (see chart for details).  Dyspnea.  Clinical history and presentation most suggestive of an ongoing prolonged COPD type exacerbation.  Recently completed antibiotic therapy.  Afebrile normal white count.  Will initiate continuous albuterol, IV Solu-Medrol, send D-dimer to screen for PE given the ongoing nature of dyspnea having  not resolved after typical COPD treatments.  No chest pain but does report tightness.  Normal troponin x2 unlikely to represent ACS suspect secondary to COPD.  No pneumothorax no clinical signs or symptoms suggest dissection.  No associate abdominal pain.  Normal mental status.  Hypoxia is notable on room air corrects with oxygen    Patient showing improvement with nebulizer treatments, but even so with improvement in her symptoms even with ambulation short distances less than 20 feet in the hallway she becomes dyspneic and does drop oxygen saturation on room air to approximately 85%.  This returned quickly upon return to room and placement on oxygen.  Additional neb given   Patient admitted to hospitalist service Dr. Arville Care.  Overall improvement in work of breathing, mild ongoing  accessory muscle use but profound hypoxia with exertion is denoted.  CT angio negative.  Patient mid to hospitalist service for further work-up  ____________________________________________   FINAL CLINICAL IMPRESSION(S) / ED DIAGNOSES  Final diagnoses:  COPD exacerbation ([facility])        Note:  This document was prepared using Dragon voice recognition software and may include unintentional dictation errors       Sharyn Creamer, MD 04/24/21 2330

## 2021-04-25 LAB — CBC
HCT: 39.2 % (ref 36.0–46.0)
Hemoglobin: 13.1 g/dL (ref 12.0–15.0)
MCH: 30.8 pg (ref 26.0–34.0)
MCHC: 33.4 g/dL (ref 30.0–36.0)
MCV: 92 fL (ref 80.0–100.0)
Platelets: 122 10*3/uL — ABNORMAL LOW (ref 150–400)
RBC: 4.26 MIL/uL (ref 3.87–5.11)
RDW: 12.9 % (ref 11.5–15.5)
WBC: 3.1 10*3/uL — ABNORMAL LOW (ref 4.0–10.5)
nRBC: 0 % (ref 0.0–0.2)

## 2021-04-25 LAB — BASIC METABOLIC PANEL
Anion gap: 10 (ref 5–15)
BUN: 12 mg/dL (ref 6–20)
CO2: 24 mmol/L (ref 22–32)
Calcium: 8.8 mg/dL — ABNORMAL LOW (ref 8.9–10.3)
Chloride: 102 mmol/L (ref 98–111)
Creatinine, Ser: 0.58 mg/dL (ref 0.44–1.00)
GFR, Estimated: >60 mL/min (ref >60)
Glucose, Bld: 211 mg/dL — ABNORMAL HIGH (ref 70–99)
Potassium: 3.8 mmol/L (ref 3.5–5.1)
Sodium: 136 mmol/L (ref 135–145)

## 2021-04-25 MED ORDER — TRAZODONE HCL 50 MG PO TABS: 25.0000 mg | ORAL_TABLET | Freq: Every evening | ORAL | Status: DC | PRN

## 2021-04-25 MED ORDER — ALUM & MAG HYDROXIDE-SIMETH 200-200-20 MG/5ML PO SUSP
30.0000 mL | Freq: Four times a day (QID) | ORAL | Status: DC | PRN
  Administered 2021-04-25 – 2021-04-26 (×2): 30 mL via ORAL
  Filled 2021-04-25 (×2): qty 30

## 2021-04-25 MED ORDER — ONDANSETRON HCL 4 MG PO TABS: 4.0000 mg | ORAL_TABLET | Freq: Four times a day (QID) | ORAL | Status: DC | PRN

## 2021-04-25 MED ORDER — GUAIFENESIN-CODEINE 100-10 MG/5ML PO SOLN
5.0000 mL | Freq: Four times a day (QID) | ORAL | Status: DC | PRN
  Administered 2021-04-25 – 2021-04-26 (×2): 5 mL via ORAL
  Filled 2021-04-25 (×4): qty 5

## 2021-04-25 MED ORDER — SODIUM CHLORIDE 0.9 % IV SOLN
1.0000 g | INTRAVENOUS | Status: DC
  Administered 2021-04-25 – 2021-04-27 (×3): 1 g via INTRAVENOUS
  Filled 2021-04-25 (×3): qty 10
  Filled 2021-04-25: qty 1

## 2021-04-25 MED ORDER — BUDESONIDE 0.25 MG/2ML IN SUSP
0.2500 mg | Freq: Two times a day (BID) | RESPIRATORY_TRACT | Status: DC
  Administered 2021-04-25 – 2021-04-27 (×4): 0.25 mg via RESPIRATORY_TRACT
  Filled 2021-04-25 (×4): qty 2

## 2021-04-25 MED ORDER — PREDNISONE 20 MG PO TABS
40.0000 mg | ORAL_TABLET | Freq: Every day | ORAL | Status: DC
  Administered 2021-04-27: 09:00:00 40 mg via ORAL
  Filled 2021-04-25: qty 2

## 2021-04-25 MED ORDER — PANTOPRAZOLE SODIUM 40 MG PO TBEC
40.0000 mg | DELAYED_RELEASE_TABLET | Freq: Every day | ORAL | Status: DC
  Administered 2021-04-25 – 2021-04-27 (×3): 40 mg via ORAL
  Filled 2021-04-25 (×3): qty 1

## 2021-04-25 MED ORDER — INFLUENZA VAC SPLIT QUAD 0.5 ML IM SUSY
0.5000 mL | PREFILLED_SYRINGE | INTRAMUSCULAR | Status: AC
  Administered 2021-04-27: 0.5 mL via INTRAMUSCULAR
  Filled 2021-04-25: qty 0.5

## 2021-04-25 MED ORDER — CYCLOBENZAPRINE HCL 10 MG PO TABS
10.0000 mg | ORAL_TABLET | Freq: Three times a day (TID) | ORAL | Status: DC | PRN
  Administered 2021-04-25 (×3): 10 mg via ORAL
  Filled 2021-04-25 (×3): qty 1

## 2021-04-25 MED ORDER — METHYLPREDNISOLONE SODIUM SUCC 40 MG IJ SOLR
40.0000 mg | Freq: Two times a day (BID) | INTRAMUSCULAR | Status: AC
  Administered 2021-04-26 (×2): 40 mg via INTRAVENOUS
  Filled 2021-04-25 (×2): qty 1

## 2021-04-25 MED ORDER — MAGNESIUM HYDROXIDE 400 MG/5ML PO SUSP
30.0000 mL | Freq: Every day | ORAL | Status: DC | PRN
  Filled 2021-04-25: qty 30

## 2021-04-25 MED ORDER — SODIUM CHLORIDE 0.9 % IV SOLN: INTRAVENOUS | Status: DC

## 2021-04-25 MED ORDER — ZOLPIDEM TARTRATE 5 MG PO TABS
5.0000 mg | ORAL_TABLET | Freq: Every day | ORAL | Status: DC
  Administered 2021-04-25 – 2021-04-26 (×2): 5 mg via ORAL
  Filled 2021-04-25 (×2): qty 1

## 2021-04-25 MED ORDER — ONDANSETRON HCL 4 MG/2ML IJ SOLN
4.0000 mg | Freq: Four times a day (QID) | INTRAMUSCULAR | Status: DC | PRN
  Administered 2021-04-25 – 2021-04-26 (×5): 4 mg via INTRAVENOUS
  Filled 2021-04-25 (×6): qty 2

## 2021-04-25 MED ORDER — ZOLPIDEM TARTRATE 5 MG PO TABS: 5.0000 mg | ORAL_TABLET | Freq: Every evening | ORAL | Status: DC | PRN

## 2021-04-25 MED ORDER — OXYCODONE-ACETAMINOPHEN 5-325 MG PO TABS
1.0000 | ORAL_TABLET | ORAL | Status: DC | PRN
  Administered 2021-04-25 – 2021-04-27 (×11): 1 via ORAL
  Filled 2021-04-25 (×12): qty 1

## 2021-04-25 MED ORDER — SODIUM CHLORIDE 0.9 % IV SOLN
12.5000 mg | Freq: Four times a day (QID) | INTRAVENOUS | Status: DC | PRN
  Administered 2021-04-25 – 2021-04-26 (×3): 12.5 mg via INTRAVENOUS
  Filled 2021-04-25 (×2): qty 12.5
  Filled 2021-04-25: qty 0.5
  Filled 2021-04-25: qty 12.5

## 2021-04-25 MED ORDER — MONTELUKAST SODIUM 10 MG PO TABS
10.0000 mg | ORAL_TABLET | Freq: Every day | ORAL | Status: DC
  Administered 2021-04-25 – 2021-04-26 (×3): 10 mg via ORAL
  Filled 2021-04-25 (×3): qty 1

## 2021-04-25 MED ORDER — IPRATROPIUM-ALBUTEROL 0.5-2.5 (3) MG/3ML IN SOLN
3.0000 mL | Freq: Four times a day (QID) | RESPIRATORY_TRACT | Status: DC
  Administered 2021-04-25 – 2021-04-27 (×7): 3 mL via RESPIRATORY_TRACT
  Filled 2021-04-25 (×8): qty 3

## 2021-04-25 MED ORDER — ACETAMINOPHEN 650 MG RE SUPP: 650.0000 mg | Freq: Four times a day (QID) | RECTAL | Status: DC | PRN

## 2021-04-25 MED ORDER — ACETAMINOPHEN 325 MG PO TABS: 650.0000 mg | ORAL_TABLET | Freq: Four times a day (QID) | ORAL | Status: DC | PRN

## 2021-04-25 MED ORDER — ALBUTEROL SULFATE (2.5 MG/3ML) 0.083% IN NEBU: 2.5000 mg | INHALATION_SOLUTION | RESPIRATORY_TRACT | Status: DC | PRN

## 2021-04-25 MED ORDER — FAMOTIDINE 20 MG PO TABS
20.0000 mg | ORAL_TABLET | Freq: Every day | ORAL | Status: DC
  Administered 2021-04-25 – 2021-04-27 (×3): 20 mg via ORAL
  Filled 2021-04-25 (×3): qty 1

## 2021-04-25 MED ORDER — EPINEPHRINE 0.3 MG/0.3ML IJ SOAJ
0.3000 mg | INTRAMUSCULAR | Status: DC | PRN
  Filled 2021-04-25: qty 0.6

## 2021-04-25 MED ORDER — GUAIFENESIN ER 600 MG PO TB12
600.0000 mg | ORAL_TABLET | Freq: Two times a day (BID) | ORAL | Status: DC
  Administered 2021-04-25 – 2021-04-27 (×5): 600 mg via ORAL
  Filled 2021-04-25 (×5): qty 1

## 2021-04-25 MED ORDER — ENOXAPARIN SODIUM 40 MG/0.4ML IJ SOSY
40.0000 mg | PREFILLED_SYRINGE | INTRAMUSCULAR | Status: DC
  Administered 2021-04-25 – 2021-04-27 (×3): 40 mg via SUBCUTANEOUS
  Filled 2021-04-25 (×3): qty 0.4

## 2021-04-25 NOTE — Progress Notes (Signed)
Nutrition Brief Note  Patient identified on the Malnutrition Screening Tool (MST) Report  Wt Readings from Last 15 Encounters:  04/24/21 51.7 kg  04/03/21 49.9 kg  03/05/21 49.9 kg  02/27/20 53.5 kg  12/28/19 50.8 kg  11/13/19 50.8 kg  04/10/19 49 kg   Grace King is a 50 y.o. female with medical history significant for asthma and COPD as well as lupus , who presented to the emergency room with acute onset of worsening dyspnea with associated cough productive of greenish sputum and wheezing over the last week.  She admitted to nausea and vomiting with excessive cough.  No fever or chills.  No abdominal pain or melena or bright red bleeding per rectum.  She denies any bilious vomitus or hematemesis.  She denies any chest pain or palpitations.  No dysuria, oliguria or hematuria or flank pain.  Pt admitted with COPD exacerbation.   Reviewed I/O's: +463 ml x 24 hours   Spoke with pt at bedside, who was pleasant and in good spirits at time. She reports good appetite has been consuming most of her meals (consumed 75% of chef salad). Pt consumes 2-3 meals per day, eating foods like soups, sandwiches, and salads.   Per pt, UBW is around 120#. She denies any weight loss. Reviewed wt hx; wt has been stable over the past month.   Nutrition-Focused physical exam completed. Findings are no fat depletion, no muscle depletion, and no edema.    Medications reviewed and include solu-medrol.   Labs reviewed.   Current diet order is Heart Healthy, patient is consuming approximately 100% of meals at this time. Labs and medications reviewed.   No nutrition interventions warranted at this time. If nutrition issues arise, please consult RD.   Levada Schilling, RD, LDN, CDCES Registered Dietitian II Certified Diabetes Care and Education Specialist Please refer to [facility] for RD and/or RD on-call/weekend/after hours pager

## 2021-04-25 NOTE — Progress Notes (Signed)
  Chaplain On-Call responded to [place] from 0239 hours from [place] Speaker: "create or update Advance Directive".  Chaplain met the patient and provided spiritual and emotional support as she is coping with COPD.  Chaplain also provided the Advance Directive documents and education to the patient, who stated that she will discuss the documents later with her daughter.  Chaplain Pollyann Samples M.Div., [facility]

## 2021-04-25 NOTE — Progress Notes (Signed)
PROGRESS NOTE    Grace King   DGL:[id number]  DOB: 1970-08-10  PCP: Lesly Rubenstein, MD    DOA: 04/24/2021 LOS: 1    Brief Narrative / Hospital Course to Date:   50 year old female with past medical history of asthma and COPD not on oxygen at baseline, lupus, GERD, migraine headaches who presented to the ED on 04/24/2021 with progressively worsening shortness of breath and associated productive cough with significant wheezing over about the past week.  She also reported nausea and vomiting associated with excessive coughing fits.  No fevers or chills.  In the ED, patient was tachycardic and tachypneic, requiring oxygen at 4 L/min to maintain O2 sats. She was admitted to hospitalist service for further evaluation and management of acute hypoxic respiratory failure secondary exacerbation of asthma and COPD.  Assessment & Plan   Principal Problem:   COPD exacerbation (HCC)   Acute respiratory failure with hypoxia secondary to Acute exacerbation of asthma and COPD and acute bronchitis Patient has no baseline requirement for supplemental oxygen, currently requiring 3 L/min. Present with significant dyspnea both at rest and with exertion and diffuse wheezing with poor air movement consistent with exacerbated COPD and asthma. --Continue empiric Rocephin and IV steroids --Pulmicort nebs (some for home Symbicort) --Scheduled DuoNebs --As needed albuterol nebs --Scheduled Mucinex and as needed guaifenesin codeine solution --Supplement oxygen as needed to maintain sats above 90%, wean as tolerated  Nausea vomiting -as needed Zofran, Phenergan if refractory --Continue maintenance IV fluids --Monitor BMP  GERD -continue PPI and H2 blocker.  History of migraines -currently without headaches.  Monitor and treat as needed.  History of lupus -appears not on any home medications.  No signs of flare/exacerbation at this time.  Monitor.   Patient BMI: Body mass index is 18.97 kg/m.    DVT prophylaxis: enoxaparin (LOVENOX) injection 40 mg Start: 04/25/21 0800   Diet:  Diet Orders (From admission, onward)     Start     Ordered   04/25/21 0125  Diet Heart Room service appropriate? Yes; Fluid consistency: Thin  Diet effective now       Question Answer Comment  Room service appropriate? Yes   Fluid consistency: Thin      04/25/21 0126              Code Status: Full Code   Subjective 04/25/21    Patient seen sitting edge of bed today.  She reports feeling slightly better but still quite short of breath.  Still nauseous this morning requiring IV antiemetics.  She reports mostly dry heaving, not actually vomiting today.  Was able to tolerate some of breakfast.  No fevers chills or other acute complaints at this time.   Disposition Plan & Communication   Status is: Inpatient  Remains inpatient appropriate because: On IV therapies and continues to require oxygen supplementation   Consults, Procedures, Significant Events   Consultants:  None  Procedures:  None  Antimicrobials:  Anti-infectives (From admission, onward)    Start     Dose/Rate Route Frequency Ordered Stop   04/25/21 0130  cefTRIAXone (ROCEPHIN) 1 g in sodium chloride 0.9 % 100 mL IVPB        1 g 200 mL/hr over 30 Minutes Intravenous Every 24 hours 04/25/21 0126 04/30/21 0129         Micro    Objective   Vitals:   04/25/21 0255 04/25/21 0527 04/25/21 0721 04/25/21 1124  BP: 134/74 128/82 129/72 133/63  Pulse: 80 76 77 78  Resp: 18 18 18 17   Temp: 98.1 F (36.7 C) 97.8 F (36.6 C) (!) 97.5 F (36.4 C) 98 F (36.7 C)  TempSrc: Oral Oral Oral Oral  SpO2: 98% 99% 98% 97%  Weight:      Height:        Intake/Output Summary (Last 24 hours) at 04/25/2021 1337 Last data filed at 04/25/2021 1043 Gross per 24 hour  Intake 703.12 ml  Output --  Net 703.12 ml   Filed Weights   04/24/21 1415  Weight: 51.7 kg    Physical Exam:  General exam: awake, alert, no acute  distress HEENT: moist mucus membranes, hearing grossly normal  Respiratory system: Very poor air movement, not currently wheezing, mildly increased respiratory effort, on 3 L/min nasal cannula oxygen. Cardiovascular system: normal S1/S2, RRR, no pedal edema.   Gastrointestinal system: soft, nontender abdomen Central nervous system: A&O x4. no gross focal neurologic deficits, normal speech Extremities: moves all, no edema, normal tone Skin: dry, intact, normal temperature Psychiatry: normal mood, congruent affect, judgement and insight appear normal  Labs   Data Reviewed: I have personally reviewed following labs and imaging studies  CBC: Recent Labs  Lab 04/24/21 1456 04/25/21 0455  WBC 4.2 3.1*  NEUTROABS 2.9  --   HGB 14.7 13.1  HCT 43.9 39.2  MCV 93.8 92.0  PLT 119* 122*   Basic Metabolic Panel: Recent Labs  Lab 04/24/21 1456 04/25/21 0455  NA 136 136  K 3.5 3.8  CL 103 102  CO2 27 24  GLUCOSE 101* 211*  BUN 12 12  CREATININE 0.70 0.58  CALCIUM 8.7* 8.8*   GFR: Estimated Creatinine Clearance: 70.2 mL/min (by C-G formula based on SCr of 0.58 mg/dL). Liver Function Tests: Recent Labs  Lab 04/24/21 1456  AST 22  ALT 20  ALKPHOS 55  BILITOT 0.5  PROT 6.7  ALBUMIN 3.8   No results for input(s): LIPASE, AMYLASE in the last 168 hours. No results for input(s): AMMONIA in the last 168 hours. Coagulation Profile: No results for input(s): INR, PROTIME in the last 168 hours. Cardiac Enzymes: No results for input(s): CKTOTAL, CKMB, CKMBINDEX, TROPONINI in the last 168 hours. BNP (last 3 results) No results for input(s): PROBNP in the last 8760 hours. HbA1C: No results for input(s): HGBA1C in the last 72 hours. CBG: No results for input(s): GLUCAP in the last 168 hours. Lipid Profile: No results for input(s): CHOL, HDL, LDLCALC, TRIG, CHOLHDL, LDLDIRECT in the last 72 hours. Thyroid Function Tests: No results for input(s): TSH, T4TOTAL, FREET4, T3FREE,  THYROIDAB in the last 72 hours. Anemia Panel: No results for input(s): VITAMINB12, FOLATE, FERRITIN, TIBC, IRON, RETICCTPCT in the last 72 hours. Sepsis Labs: No results for input(s): PROCALCITON, LATICACIDVEN in the last 168 hours.  Recent Results (from the past 240 hour(s))  Resp Panel by RT-PCR (Flu A&B, Covid) Nasopharyngeal Swab     Status: None   Collection Time: 04/24/21  2:56 PM   Specimen: Nasopharyngeal Swab; Nasopharyngeal(NP) swabs in vial transport medium  Result Value Ref Range Status   SARS Coronavirus 2 by RT PCR NEGATIVE NEGATIVE Final    Comment: (NOTE) SARS-CoV-2 target nucleic acids are NOT DETECTED.  The SARS-CoV-2 RNA is generally detectable in upper respiratory specimens during the acute phase of infection. The lowest concentration of SARS-CoV-2 viral copies this assay can detect is 138 copies/mL. A negative result does not preclude SARS-Cov-2 infection and should not be used as the sole basis for treatment or other patient management  decisions. A negative result may occur with  improper specimen collection/handling, submission of specimen other than nasopharyngeal swab, presence of viral mutation(s) within the areas targeted by this assay, and inadequate number of viral copies(<138 copies/mL). A negative result must be combined with clinical observations, patient history, and epidemiological information. The expected result is Negative.  Fact Sheet for Patients:  BloggerCourse.com  Fact Sheet for Healthcare Providers:  SeriousBroker.it  This test is no t yet approved or cleared by the [place] FDA and  has been authorized for detection and/or diagnosis of SARS-CoV-2 by FDA under an Emergency Use Authorization (EUA). This EUA will remain  in effect (meaning this test can be used) for the duration of the COVID-19 declaration under Section 564(b)(1) of the Act, 21 U.S.C.section 360bbb-3(b)(1), unless the  authorization is terminated  or revoked sooner.       Influenza A by PCR NEGATIVE NEGATIVE Final   Influenza B by PCR NEGATIVE NEGATIVE Final    Comment: (NOTE) The Xpert Xpress SARS-CoV-2/FLU/RSV plus assay is intended as an aid in the diagnosis of influenza from Nasopharyngeal swab specimens and should not be used as a sole basis for treatment. Nasal washings and aspirates are unacceptable for Xpert Xpress SARS-CoV-2/FLU/RSV testing.  Fact Sheet for Patients: BloggerCourse.com  Fact Sheet for Healthcare Providers: SeriousBroker.it  This test is not yet approved or cleared by the [place] FDA and has been authorized for detection and/or diagnosis of SARS-CoV-2 by FDA under an Emergency Use Authorization (EUA). This EUA will remain in effect (meaning this test can be used) for the duration of the COVID-19 declaration under Section 564(b)(1) of the Act, 21 U.S.C. section 360bbb-3(b)(1), unless the authorization is terminated or revoked.  Performed at [facility], [address], [place], [place] [address]       Imaging Studies   DG Chest 2 View  Result Date: 04/24/2021 CLINICAL DATA:  Cough and shortness of breath. EXAM: CHEST - 2 VIEW COMPARISON:  One-view chest x-ray 02/29/2020. Two-view chest x-ray 02/27/2020 FINDINGS: Changes of COPD again noted. Peripheral bulla lie in the right upper lobe are stable. No edema or effusion is present. No focal airspace disease is present. Axial skeleton is within normal limits. IMPRESSION: 1. No acute cardiopulmonary disease or significant interval change. 2. Stable changes of COPD. Electronically Signed   By: Marin Roberts M.D.   On: 04/24/2021 14:55   CT Angio Chest PE W and/or Wo Contrast  Result Date: 04/24/2021 CLINICAL DATA:  Shortness of breath and cough EXAM: CT ANGIOGRAPHY CHEST WITH CONTRAST TECHNIQUE: Multidetector CT imaging of the chest was performed  using the standard protocol during bolus administration of intravenous contrast. Multiplanar CT image reconstructions and MIPs were obtained to evaluate the vascular anatomy. CONTRAST:  78mL OMNIPAQUE IOHEXOL 350 MG/ML SOLN COMPARISON:  Chest x-ray 04/24/2021 FINDINGS: Cardiovascular: Satisfactory opacification of the pulmonary arteries to the segmental level. No evidence of pulmonary embolism. Normal heart size. No pericardial effusion. Nonaneurysmal aorta. No dissection is seen. Mild atherosclerosis. Mediastinum/Nodes: No enlarged mediastinal, hilar, or axillary lymph nodes. Thyroid gland, trachea, and esophagus demonstrate no significant findings. Lungs/Pleura: Advanced emphysema. No acute consolidation, pleural effusion or pneumothorax. Upper Abdomen: No acute abnormality. Musculoskeletal: No chest wall abnormality. No acute or significant osseous findings. Review of the MIP images confirms the above findings. IMPRESSION: 1. Negative for acute pulmonary embolus. 2. Emphysema without acute airspace disease Aortic Atherosclerosis (ICD10-I70.0) and Emphysema (ICD10-J43.9). Electronically Signed   By: Jasmine Pang M.D.   On: 04/24/2021  22:32     Medications   Scheduled Meds:  budesonide (PULMICORT) nebulizer solution  0.25 mg Nebulization BID   enoxaparin (LOVENOX) injection  40 mg Subcutaneous Q24H   famotidine  20 mg Oral Daily   guaiFENesin  600 mg Oral BID   [START ON 04/26/2021] influenza vac split quadrivalent PF  0.5 mL Intramuscular Tomorrow-1000   ipratropium-albuterol  3 mL Nebulization Q6H WA   [START ON 04/26/2021] methylPREDNISolone (SOLU-MEDROL) injection  40 mg Intravenous Q12H   Followed by   Melene Muller ON 04/27/2021] predniSONE  40 mg Oral Q breakfast   montelukast  10 mg Oral QHS   pantoprazole  40 mg Oral Daily   zolpidem  5 mg Oral QHS   Continuous Infusions:  sodium chloride 100 mL/hr at 04/25/21 0311   cefTRIAXone (ROCEPHIN)  IV Stopped (04/25/21 0500)   promethazine  (PHENERGAN) injection (IM or IVPB)         LOS: 1 day    Time spent: 30 minutes    Pennie Banter, DO [facility]  04/25/2021, 1:37 PM      If 7PM-7AM, please contact night-coverage. How to contact the [facility] Attending or Consulting provider 7A - 7P or covering provider during after hours 7P -7A, for this patient?    Check the care team in [facility] and look for a) attending/consulting [facility] provider listed and b) the [facility] team listed Log into [email or web address] and use [place]'s universal password to access. If you do not have the password, please contact the hospital operator. Locate the [facility] provider you are looking for under [facility] and page to a number that you can be directly reached. If you still have difficulty reaching the provider, please page the [phone] (Director on Call) for the Hospitalists listed on amion for assistance.

## 2021-04-26 LAB — BASIC METABOLIC PANEL
Anion gap: 6 (ref 5–15)
BUN: 11 mg/dL (ref 6–20)
CO2: 27 mmol/L (ref 22–32)
Calcium: 8.4 mg/dL — ABNORMAL LOW (ref 8.9–10.3)
Chloride: 107 mmol/L (ref 98–111)
Creatinine, Ser: 0.63 mg/dL (ref 0.44–1.00)
GFR, Estimated: >60 mL/min (ref >60)
Glucose, Bld: 100 mg/dL — ABNORMAL HIGH (ref 70–99)
Potassium: 3.6 mmol/L (ref 3.5–5.1)
Sodium: 140 mmol/L (ref 135–145)

## 2021-04-26 LAB — MAGNESIUM: Magnesium: 2.4 mg/dL (ref 1.7–2.4)

## 2021-04-26 LAB — CBC
HCT: 34.3 % — ABNORMAL LOW (ref 36.0–46.0)
Hemoglobin: 11.3 g/dL — ABNORMAL LOW (ref 12.0–15.0)
MCH: 31 pg (ref 26.0–34.0)
MCHC: 32.9 g/dL (ref 30.0–36.0)
MCV: 94.2 fL (ref 80.0–100.0)
Platelets: 107 10*3/uL — ABNORMAL LOW (ref 150–400)
RBC: 3.64 MIL/uL — ABNORMAL LOW (ref 3.87–5.11)
RDW: 12.6 % (ref 11.5–15.5)
WBC: 4.7 10*3/uL (ref 4.0–10.5)
nRBC: 0 % (ref 0.0–0.2)

## 2021-04-26 NOTE — Progress Notes (Signed)
PROGRESS NOTE    Grace King   CHE:[id number]  DOB: 06/01/1971  PCP: Lesly Rubenstein, MD    DOA: 04/24/2021 LOS: 2    Brief Narrative / Hospital Course to Date:   50 year old female with past medical history of asthma and COPD not on oxygen at baseline, lupus, GERD, migraine headaches who presented to the ED on 04/24/2021 with progressively worsening shortness of breath and associated productive cough with significant wheezing over about the past week.  She also reported nausea and vomiting associated with excessive coughing fits.  No fevers or chills.  In the ED, patient was tachycardic and tachypneic, requiring oxygen at 4 L/min to maintain O2 sats. She was admitted to hospitalist service for further evaluation and management of acute hypoxic respiratory failure secondary exacerbation of asthma and COPD.  11/23 still DOE with just walking to bathroom. Weaned to 2L [place] this am. No cp  Assessment & Plan   Principal Problem:   COPD exacerbation (HCC)   Acute respiratory failure with hypoxia secondary to Acute exacerbation of asthma and COPD and acute bronchitis Patient has no baseline requirement for supplemental oxygen, currently requiring 3 L/min. Present with significant dyspnea both at rest and with exertion and diffuse wheezing with poor air movement consistent with exacerbated COPD and asthma. 11/23 continue abx Continue iv steroid, if can will switch to po in am Continue nebs Continue mucinex    Nausea vomiting - Appears to have improved Dc ivf    GERD - Continue PPI and famotidine  History of migraines - Asx    History of lupus -appears not on any home medications.  No signs of flare/exacerbation at this time.  Monitor.   Patient BMI: Body mass index is 18.97 kg/m.   DVT prophylaxis: enoxaparin (LOVENOX) injection 40 mg Start: 04/25/21 0800   Diet:  Diet Orders (From admission, onward)     Start     Ordered   04/25/21 0125  Diet Heart Room  service appropriate? Yes; Fluid consistency: Thin  Diet effective now       Question Answer Comment  Room service appropriate? Yes   Fluid consistency: Thin      04/25/21 0126              Code Status: Full Code   Subjective 04/26/21   Still with DOE. No cp.    Disposition Plan & Communication   Status is: Inpatient  Remains inpatient appropriate because: On IV therapies and continues to require oxygen supplementation   Consults, Procedures, Significant Events   Consultants:  None  Procedures:  None  Antimicrobials:  Anti-infectives (From admission, onward)    Start     Dose/Rate Route Frequency Ordered Stop   04/25/21 0130  cefTRIAXone (ROCEPHIN) 1 g in sodium chloride 0.9 % 100 mL IVPB        1 g 200 mL/hr over 30 Minutes Intravenous Every 24 hours 04/25/21 0126 04/30/21 0129         Micro    Objective   Vitals:   04/25/21 2038 04/25/21 2154 04/26/21 0505 04/26/21 0755  BP:  137/77 103/75 138/90  Pulse:  72 79 69  Resp:  17 19 16   Temp:  97.8 F (36.6 C) 98.8 F (37.1 C) (!) 97.5 F (36.4 C)  TempSrc:   Oral Oral  SpO2: 98% 100% 94% 100%  Weight:      Height:        Intake/Output Summary (Last 24 hours) at 04/26/2021 0930 Last data filed  at 04/26/2021 0549 Gross per 24 hour  Intake 3421.64 ml  Output --  Net 3421.64 ml   Filed Weights   04/24/21 1415  Weight: 51.7 kg    Physical Exam: Nad, calm No wheezing, decrease air exchange Reg s1/s2 no gallops Soft benign +bs No edema aaoxox3  Labs   Data Reviewed: I have personally reviewed following labs and imaging studies  CBC: Recent Labs  Lab 04/24/21 1456 04/25/21 0455 04/26/21 0431  WBC 4.2 3.1* 4.7  NEUTROABS 2.9  --   --   HGB 14.7 13.1 11.3*  HCT 43.9 39.2 34.3*  MCV 93.8 92.0 94.2  PLT 119* 122* 107*   Basic Metabolic Panel: Recent Labs  Lab 04/24/21 1456 04/25/21 0455 04/26/21 0431  NA 136 136 140  K 3.5 3.8 3.6  CL 103 102 107  CO2 27 24 27   GLUCOSE  101* 211* 100*  BUN 12 12 11   CREATININE 0.70 0.58 0.63  CALCIUM 8.7* 8.8* 8.4*  MG  --   --  2.4   GFR: Estimated Creatinine Clearance: 70.2 mL/min (by C-G formula based on SCr of 0.63 mg/dL). Liver Function Tests: Recent Labs  Lab 04/24/21 1456  AST 22  ALT 20  ALKPHOS 55  BILITOT 0.5  PROT 6.7  ALBUMIN 3.8   No results for input(s): LIPASE, AMYLASE in the last 168 hours. No results for input(s): AMMONIA in the last 168 hours. Coagulation Profile: No results for input(s): INR, PROTIME in the last 168 hours. Cardiac Enzymes: No results for input(s): CKTOTAL, CKMB, CKMBINDEX, TROPONINI in the last 168 hours. BNP (last 3 results) No results for input(s): PROBNP in the last 8760 hours. HbA1C: No results for input(s): HGBA1C in the last 72 hours. CBG: No results for input(s): GLUCAP in the last 168 hours. Lipid Profile: No results for input(s): CHOL, HDL, LDLCALC, TRIG, CHOLHDL, LDLDIRECT in the last 72 hours. Thyroid Function Tests: No results for input(s): TSH, T4TOTAL, FREET4, T3FREE, THYROIDAB in the last 72 hours. Anemia Panel: No results for input(s): VITAMINB12, FOLATE, FERRITIN, TIBC, IRON, RETICCTPCT in the last 72 hours. Sepsis Labs: No results for input(s): PROCALCITON, LATICACIDVEN in the last 168 hours.  Recent Results (from the past 240 hour(s))  Resp Panel by RT-PCR (Flu A&B, Covid) Nasopharyngeal Swab     Status: None   Collection Time: 04/24/21  2:56 PM   Specimen: Nasopharyngeal Swab; Nasopharyngeal(NP) swabs in vial transport medium  Result Value Ref Range Status   SARS Coronavirus 2 by RT PCR NEGATIVE NEGATIVE Final    Comment: (NOTE) SARS-CoV-2 target nucleic acids are NOT DETECTED.  The SARS-CoV-2 RNA is generally detectable in upper respiratory specimens during the acute phase of infection. The lowest concentration of SARS-CoV-2 viral copies this assay can detect is 138 copies/mL. A negative result does not preclude SARS-Cov-2 infection and  should not be used as the sole basis for treatment or other patient management decisions. A negative result may occur with  improper specimen collection/handling, submission of specimen other than nasopharyngeal swab, presence of viral mutation(s) within the areas targeted by this assay, and inadequate number of viral copies(<138 copies/mL). A negative result must be combined with clinical observations, patient history, and epidemiological information. The expected result is Negative.  Fact Sheet for Patients:  04/26/21  Fact Sheet for Healthcare Providers:  04/26/21  This test is no t yet approved or cleared by the [employer] FDA and  has been authorized for detection and/or diagnosis of SARS-CoV-2 by FDA under an  Emergency Use Authorization (EUA). This EUA will remain  in effect (meaning this test can be used) for the duration of the COVID-19 declaration under Section 564(b)(1) of the Act, 21 U.S.C.section 360bbb-3(b)(1), unless the authorization is terminated  or revoked sooner.       Influenza A by PCR NEGATIVE NEGATIVE Final   Influenza B by PCR NEGATIVE NEGATIVE Final    Comment: (NOTE) The Xpert Xpress SARS-CoV-2/FLU/RSV plus assay is intended as an aid in the diagnosis of influenza from Nasopharyngeal swab specimens and should not be used as a sole basis for treatment. Nasal washings and aspirates are unacceptable for Xpert Xpress SARS-CoV-2/FLU/RSV testing.  Fact Sheet for Patients: [employer]com  Fact Sheet for Healthcare Providers: [employer]ious[employer]it  This test is not yet approved or cleared by the [place] FDA and has been authorized for detection and/or diagnosis of SARS-CoV-2 by FDA under an Emergency Use Authorization (EUA). This EUA will remain in effect (meaning this test can be used) for the duration of the COVID-19 declaration  under Section 564(b)(1) of the Act, 21 U.S.C. section 360bbb-3(b)(1), unless the authorization is terminated or revoked.  Performed at [facility], [address] [place], [place] [address]       Imaging Studies   DG Chest 2 View  Result Date: 04/24/2021 CLINICAL DATA:  Cough and shortness of breath. EXAM: CHEST - 2 VIEW COMPARISON:  One-view chest x-ray 02/29/2020. Two-view chest x-ray 02/27/2020 FINDINGS: Changes of COPD again noted. Peripheral bulla lie in the right upper lobe are stable. No edema or effusion is present. No focal airspace disease is present. Axial skeleton is within normal limits. IMPRESSION: 1. No acute cardiopulmonary disease or significant interval change. 2. Stable changes of COPD. Electronically Signed   By: Marin Roberts M.D.   On: 04/24/2021 14:55   CT Angio Chest PE W and/or Wo Contrast  Result Date: 04/24/2021 CLINICAL DATA:  Shortness of breath and cough EXAM: CT ANGIOGRAPHY CHEST WITH CONTRAST TECHNIQUE: Multidetector CT imaging of the chest was performed using the standard protocol during bolus administration of intravenous contrast. Multiplanar CT image reconstructions and MIPs were obtained to evaluate the vascular anatomy. CONTRAST:  28mL OMNIPAQUE IOHEXOL 350 MG/ML SOLN COMPARISON:  Chest x-ray 04/24/2021 FINDINGS: Cardiovascular: Satisfactory opacification of the pulmonary arteries to the segmental level. No evidence of pulmonary embolism. Normal heart size. No pericardial effusion. Nonaneurysmal aorta. No dissection is seen. Mild atherosclerosis. Mediastinum/Nodes: No enlarged mediastinal, hilar, or axillary lymph nodes. Thyroid gland, trachea, and esophagus demonstrate no significant findings. Lungs/Pleura: Advanced emphysema. No acute consolidation, pleural effusion or pneumothorax. Upper Abdomen: No acute abnormality. Musculoskeletal: No chest wall abnormality. No acute or significant osseous findings. Review of the MIP images confirms  the above findings. IMPRESSION: 1. Negative for acute pulmonary embolus. 2. Emphysema without acute airspace disease Aortic Atherosclerosis (ICD10-I70.0) and Emphysema (ICD10-J43.9). Electronically Signed   By: Jasmine Pang M.D.   On: 04/24/2021 22:32     Medications   Scheduled Meds:  budesonide (PULMICORT) nebulizer solution  0.25 mg Nebulization BID   enoxaparin (LOVENOX) injection  40 mg Subcutaneous Q24H   famotidine  20 mg Oral Daily   guaiFENesin  600 mg Oral BID   influenza vac split quadrivalent PF  0.5 mL Intramuscular Tomorrow-1000   ipratropium-albuterol  3 mL Nebulization Q6H WA   methylPREDNISolone (SOLU-MEDROL) injection  40 mg Intravenous Q12H   Followed by   Melene Muller ON 04/27/2021] predniSONE  40 mg Oral Q breakfast   montelukast  10 mg Oral  QHS   pantoprazole  40 mg Oral Daily   zolpidem  5 mg Oral QHS   Continuous Infusions:  sodium chloride 100 mL/hr at 04/26/21 0806   cefTRIAXone (ROCEPHIN)  IV Stopped (04/26/21 0300)   promethazine (PHENERGAN) injection (IM or IVPB) 12.5 mg (04/26/21 0816)       LOS: 2 days    Time spent: 35 min with >50% on coc    Lynn Ito, MD [facility]  04/26/2021, 9:30 AM      If 7PM-7AM, please contact night-coverage. How to contact the [facility] Attending or Consulting provider 7A - 7P or covering provider during after hours 7P -7A, for this patient?

## 2021-04-26 NOTE — TOC Initial Note (Addendum)
Transition of Care [facility]) - Initial/Assessment Note    Patient Details  Name: Grace King MRN: [id number] Date of Birth: [birth year]  [facility] CM/SW Contact:    Caryn Section, RN Phone Number: 04/26/2021, 3:50 PM  Clinical Narrative:    Patient lives with her daughter, who can help her at home if needed.  Daughter transports patient to appointments and pharmacy.  Patient reports she is able to take medications as directed.    Patient may need home oxygen, staff will measure oxygen saturation and document.  Awaiting documentation.            Patient typically needs walker to ambulate, she last got a walker 10 years ago.  She states she only uses walker to assist when she gets winded.  She does not currently have home health.  She does not feel like she will require home health.    Addendum walker ordered with [employer], to be delivered.  TOC contact information provided, TOC to follow to discharge.       Patient Goals and CMS Choice        Expected Discharge Plan and Services                                                Prior Living Arrangements/Services                       Activities of Daily Living Home Assistive Devices/Equipment: None ADL Screening (condition at time of admission) Patient's cognitive ability adequate to safely complete daily activities?: Yes Is the patient deaf or have difficulty hearing?: No Does the patient have difficulty seeing, even when wearing glasses/contacts?: No Does the patient have difficulty concentrating, remembering, or making decisions?: No Patient able to express need for assistance with ADLs?: Yes Does the patient have difficulty dressing or bathing?: No Independently performs ADLs?: Yes (appropriate for developmental age) Does the patient have difficulty walking or climbing stairs?: Yes Weakness of Legs: None Weakness of Arms/Hands: None  Permission Sought/Granted                   Emotional Assessment              Admission diagnosis:  COPD exacerbation (HCC) [J44.1] Patient Active Problem List   Diagnosis Date Noted   COPD exacerbation (HCC) 02/28/2020   PCP:  Lesly Rubenstein, MD Pharmacy:   [facility] [phone] Dan Humphreys, [place] - [address] AT [facility] [address] [facility] [place] [address] Phone: [phone] Fax: [phone]     Social Determinants of Health (SDOH) Interventions    Readmission Risk Interventions No flowsheet data found.

## 2021-04-26 NOTE — Progress Notes (Signed)
SATURATION QUALIFICATIONS: (This note is used to comply with regulatory documentation for home oxygen)  Patient Saturations on Room Air at Rest = 92%  Patient Saturations on Room Air while Ambulating = 85%  Patient Saturations on 2 Liters of oxygen while Ambulating = 97%  Please briefly explain why patient needs home oxygen:  patient also experienced shortness of breath on exertion

## 2021-04-27 LAB — CBC
HCT: 42.2 % (ref 36.0–46.0)
Hemoglobin: 14 g/dL (ref 12.0–15.0)
MCH: 31.1 pg (ref 26.0–34.0)
MCHC: 33.2 g/dL (ref 30.0–36.0)
MCV: 93.8 fL (ref 80.0–100.0)
Platelets: 145 10*3/uL — ABNORMAL LOW (ref 150–400)
RBC: 4.5 MIL/uL (ref 3.87–5.11)
RDW: 12.4 % (ref 11.5–15.5)
WBC: 4.7 10*3/uL (ref 4.0–10.5)
nRBC: 0 % (ref 0.0–0.2)

## 2021-04-27 MED ORDER — GUAIFENESIN ER 600 MG PO TB12: 600.0000 mg | ORAL_TABLET | Freq: Two times a day (BID) | ORAL | 0 refills | Status: AC

## 2021-04-27 MED ORDER — PREDNISONE 20 MG PO TABS: 40.0000 mg | ORAL_TABLET | Freq: Every day | ORAL | 0 refills | Status: AC

## 2021-04-27 MED ORDER — LEVOFLOXACIN 500 MG PO TABS: 500.0000 mg | ORAL_TABLET | Freq: Every day | ORAL | 0 refills | Status: AC

## 2021-04-27 NOTE — TOC Transition Note (Signed)
Transition of Care [facility]) - CM/SW Discharge Note   Patient Details  Name: Grace King MRN: [id number] Date of Birth: 28-May-1971  [facility] CM/SW Contact:  Allayne Butcher, RN Phone Number: 04/27/2021, 10:48 AM   Clinical Narrative:    Once oxygen delivered patient will be able to discharge home.  Patient's daughter will be picking her up today.     Final next level of care: Home/Self Care Barriers to Discharge: Barriers Resolved   Patient Goals and CMS Choice Patient states their goals for this hospitalization and ongoing recovery are:: Just wants to get home      Discharge Placement                       Discharge Plan and Services   Discharge Planning Services: CM Consult            DME Arranged: Oxygen, Walker rolling DME Agency: [employer] Date DME Agency Contacted: 04/27/21 Time DME Agency Contacted: [phone] Representative spoke with at DME Agency: [employer] main number HH Arranged: NA HH Agency: NA        Social Determinants of Health (SDOH) Interventions     Readmission Risk Interventions No flowsheet data found.

## 2021-04-27 NOTE — Discharge Summary (Signed)
Grace King [id number] DOB: 1971/01/02 DOA: 04/24/2021  PCP: Annice Needy, MD  Admit date: 04/24/2021 Discharge date: 04/27/2021  Admitted From: home Disposition:  home  Recommendations for Outpatient Follow-up:  Follow up with PCP in 1 week Please obtain BMP/CBC in one week       Discharge Condition:Stable CODE STATUS:full  Diet recommendation: Heart Healthy Brief/Interim Summary: Per HPI: Grace King is a 50 y.o. female with medical history significant for asthma and COPD as well as lupus , who presented to the emergency room with acute onset of worsening dyspnea with associated cough productive of greenish sputum and wheezing over the last week.  She admitted to nausea and vomiting with excessive cough.  No fever or chills.  No abdominal pain or melena or bright red bleeding per rectum.  She denies any bilious vomitus or hematemesis.  She denies any chest pain or palpitations.  No dysuria, oliguria or hematuria or flank pain. : When she came to the ER blood pressure was 127/90 with heart rate of 118 with otherwise normal vital signs.  Pulsoxymeter is dropped to 84% to upon ambulation her pulse oximetry was normalized with 4 L of O2 by nasal cannula.  Labs revealed borderline potassium at 3.5 with otherwise unremarkable BMP.  CBC was within normal.  High-sensitivity troponin was 4 twice and BNP 19.1.  Follows antigens and COVID-19 PCR came back negative.  D-dimer was 0.83 CTA 1. Negative for acute pulmonary embolus. 2. Emphysema without acute airspace disease   Acute respiratory failure with hypoxia secondary to Acute exacerbation of asthma and COPD and acute bronchitis Patient has no baseline requirement for supplemental oxygen Started on iv steroid with transition to po on discharge Started on antibiotics for COPD exacerbation with transition to p.o. Continue inhalers Continue Mucinex Will be discharged with 2 L O2 as she requires and try to wean down off as tolerated as  outpatient by her PCP Needs to follow-up with PCP for further management Was told to avoid fires and cigarette smoker since she is on oxygen due to risk of combustion.  Verbalized an understanding       Nausea vomiting - Was started on IV fluids has resolved.       GERD - Continue home meds  History of migraines - Asymptomatic continue home meds       Discharge Diagnoses:  Principal Problem:   COPD exacerbation [name])    Discharge Instructions  Discharge Instructions     Call MD for:  difficulty breathing, headache or visual disturbances   Complete by: As directed    Call MD for:  temperature >100.4   Complete by: As directed    Diet - low sodium heart healthy   Complete by: As directed    Discharge instructions   Complete by: As directed    Avoid fire and cigarettes since on 0xygen. F/u with pcp   Increase activity slowly   Complete by: As directed       Allergies as of 04/27/2021       Reactions   Codeine Itching   Pills   Cucumber Extract Anaphylaxis   Pickles    Diclofenac Potassium Hives   Duloxetine Rash   Hydrocodone-acetaminophen Anaphylaxis   Oxycodone Itching   Pregabalin Hives, Rash   Shellfish Allergy Anaphylaxis   Erythromycin    Latex    Morphine Itching   Patient itched after administering 4 mg morphine   Other    pickles   Peanut-containing Drug Products  Penicillins    Ketorolac Rash   Tramadol Rash        Medication List     STOP taking these medications    Cheratussin AC 100-10 MG/5ML syrup Generic drug: guaiFENesin-codeine   diclofenac 50 MG EC tablet Commonly known as: VOLTAREN       TAKE these medications    albuterol (2.5 MG/3ML) 0.083% nebulizer solution Commonly known as: PROVENTIL Inhale 3 mLs (2.5 mg total) into the lungs every 4 (four) hours as needed.   budesonide-formoterol 160-4.5 MCG/ACT inhaler Commonly known as: SYMBICORT Inhale 2 puffs into the lungs 2 (two) times daily.   EPINEPHrine  0.3 mg/0.3 mL Soaj injection Commonly known as: EPI-PEN Inject 0.3 mLs (0.3 mg total) into the muscle as needed for anaphylaxis.   guaiFENesin 600 MG 12 hr tablet Commonly known as: MUCINEX Take 1 tablet (600 mg total) by mouth 2 (two) times daily for 7 days.   levofloxacin 500 MG tablet Commonly known as: LEVAQUIN Take 1 tablet (500 mg total) by mouth daily for 3 days.   montelukast 10 MG tablet Commonly known as: SINGULAIR Take 10 mg by mouth at bedtime.   oxyCODONE-acetaminophen 5-325 MG tablet Commonly known as: PERCOCET/ROXICET Take 1 tablet by mouth every 6 (six) hours as needed for severe pain.   pantoprazole 40 MG tablet Commonly known as: PROTONIX Take 40 mg by mouth 2 (two) times daily.   predniSONE 20 MG tablet Commonly known as: DELTASONE Take 2 tablets (40 mg total) by mouth daily with breakfast for 4 days. Start taking on: April 28, 2021   Spiriva HandiHaler 18 MCG inhalation capsule Generic drug: tiotropium Place 18 mcg into inhaler and inhale daily.   SUMAtriptan 50 MG tablet Commonly known as: IMITREX Take 50 mg by mouth daily as needed for migraine. (May repeat after 2 hours if needed)   tiZANidine 4 MG tablet Commonly known as: ZANAFLEX Take 4 mg by mouth 3 (three) times daily.               Durable Medical Equipment  (From admission, onward)           Start     Ordered   04/27/21 0829  For home use only DME oxygen  Once       Question Answer Comment  Length of Need 6 Months   Mode or (Route) Nasal cannula   Liters per Minute 2   Oxygen conserving device Yes   Oxygen delivery system Gas      04/27/21 0828   04/26/21 1607  For home use only DME Walker rolling  Once       Question Answer Comment  Walker: With 5 Inch Wheels   Patient needs a walker to treat with the following condition Breathing difficulty      04/26/21 1607            Follow-up Information     Lesly Rubenstein, MD Follow up in 1 week(s).   Specialty:  Internal Medicine Why: Patient to make own follow up appt. Office closed at this time Contact information: [address] [place] [address] [phone]                Allergies  Allergen Reactions   Codeine Itching    Pills   Cucumber Extract Anaphylaxis    Pickles    Diclofenac Potassium Hives   Duloxetine Rash   Hydrocodone-Acetaminophen Anaphylaxis   Oxycodone Itching   Pregabalin Hives and Rash  Shellfish Allergy Anaphylaxis   Erythromycin    Latex    Morphine Itching    Patient itched after administering 4 mg morphine   Other     pickles   Peanut-Containing Drug Products    Penicillins    Ketorolac Rash   Tramadol Rash    Consultations:    Procedures/Studies: DG Chest 2 View  Result Date: 04/24/2021 CLINICAL DATA:  Cough and shortness of breath. EXAM: CHEST - 2 VIEW COMPARISON:  One-view chest x-ray 02/29/2020. Two-view chest x-ray 02/27/2020 FINDINGS: Changes of COPD again noted. Peripheral bulla lie in the right upper lobe are stable. No edema or effusion is present. No focal airspace disease is present. Axial skeleton is within normal limits. IMPRESSION: 1. No acute cardiopulmonary disease or significant interval change. 2. Stable changes of COPD. Electronically Signed   By: San Morelle M.D.   On: 04/24/2021 14:55   CT Angio Chest PE W and/or Wo Contrast  Result Date: 04/24/2021 CLINICAL DATA:  Shortness of breath and cough EXAM: CT ANGIOGRAPHY CHEST WITH CONTRAST TECHNIQUE: Multidetector CT imaging of the chest was performed using the standard protocol during bolus administration of intravenous contrast. Multiplanar CT image reconstructions and MIPs were obtained to evaluate the vascular anatomy. CONTRAST:  3mL OMNIPAQUE IOHEXOL 350 MG/ML SOLN COMPARISON:  Chest x-ray 04/24/2021 FINDINGS: Cardiovascular: Satisfactory opacification of the pulmonary arteries to the segmental level. No evidence of pulmonary embolism. Normal heart  size. No pericardial effusion. Nonaneurysmal aorta. No dissection is seen. Mild atherosclerosis. Mediastinum/Nodes: No enlarged mediastinal, hilar, or axillary lymph nodes. Thyroid gland, trachea, and esophagus demonstrate no significant findings. Lungs/Pleura: Advanced emphysema. No acute consolidation, pleural effusion or pneumothorax. Upper Abdomen: No acute abnormality. Musculoskeletal: No chest wall abnormality. No acute or significant osseous findings. Review of the MIP images confirms the above findings. IMPRESSION: 1. Negative for acute pulmonary embolus. 2. Emphysema without acute airspace disease Aortic Atherosclerosis (ICD10-I70.0) and Emphysema (ICD10-J43.9). Electronically Signed   By: Donavan Foil M.D.   On: 04/24/2021 22:32      Subjective: Sob improving. No cp  Discharge Exam: Vitals:   04/27/21 0754 04/27/21 1123  BP:  (!) 148/83  Pulse:  78  Resp:  18  Temp:  (!) 97.5 F (36.4 C)  SpO2: 97% 93%   Vitals:   04/27/21 0424 04/27/21 0720 04/27/21 0754 04/27/21 1123  BP: (!) 158/94 (!) 151/85  (!) 148/83  Pulse: 82 79  78  Resp: 14 16  18   Temp: 97.7 F (36.5 C) 98.1 F (36.7 C)  (!) 97.5 F (36.4 C)  TempSrc: Oral Oral  Oral  SpO2: 98% 100% 97% 93%  Weight:      Height:        General: Pt is alert, awake, not in acute distress Cardiovascular: RRR, S1/S2 +, no rubs, no gallops Respiratory: CTA bilaterally, no wheezing, no rhonchi Abdominal: Soft, NT, ND, bowel sounds + Extremities: no edema, no cyanosis    The results of significant diagnostics from this hospitalization (including imaging, microbiology, ancillary and laboratory) are listed below for reference.     Microbiology: Recent Results (from the past 240 hour(s))  Resp Panel by RT-PCR (Flu A&B, Covid) Nasopharyngeal Swab     Status: None   Collection Time: 04/24/21  2:56 PM   Specimen: Nasopharyngeal Swab; Nasopharyngeal(NP) swabs in vial transport medium  Result Value Ref Range Status   SARS  Coronavirus 2 by RT PCR NEGATIVE NEGATIVE Final    Comment: (NOTE) SARS-CoV-2 target nucleic acids are NOT  DETECTED.  The SARS-CoV-2 RNA is generally detectable in upper respiratory specimens during the acute phase of infection. The lowest concentration of SARS-CoV-2 viral copies this assay can detect is 138 copies/mL. A negative result does not preclude SARS-Cov-2 infection and should not be used as the sole basis for treatment or other patient management decisions. A negative result may occur with  improper specimen collection/handling, submission of specimen other than nasopharyngeal swab, presence of viral mutation(s) within the areas targeted by this assay, and inadequate number of viral copies(<138 copies/mL). A negative result must be combined with clinical observations, patient history, and epidemiological information. The expected result is Negative.  Fact Sheet for Patients:  EntrepreneurPulse.com.au  Fact Sheet for Healthcare Providers:  IncredibleEmployment.be  This test is no t yet approved or cleared by the [place] FDA and  has been authorized for detection and/or diagnosis of SARS-CoV-2 by FDA under an Emergency Use Authorization (EUA). This EUA will remain  in effect (meaning this test can be used) for the duration of the COVID-19 declaration under Section 564(b)(1) of the Act, 21 U.S.C.section 360bbb-3(b)(1), unless the authorization is terminated  or revoked sooner.       Influenza A by PCR NEGATIVE NEGATIVE Final   Influenza B by PCR NEGATIVE NEGATIVE Final    Comment: (NOTE) The Xpert Xpress SARS-CoV-2/FLU/RSV plus assay is intended as an aid in the diagnosis of influenza from Nasopharyngeal swab specimens and should not be used as a sole basis for treatment. Nasal washings and aspirates are unacceptable for Xpert Xpress SARS-CoV-2/FLU/RSV testing.  Fact Sheet for  Patients: EntrepreneurPulse.com.au  Fact Sheet for Healthcare Providers: IncredibleEmployment.be  This test is not yet approved or cleared by the [place] FDA and has been authorized for detection and/or diagnosis of SARS-CoV-2 by FDA under an Emergency Use Authorization (EUA). This EUA will remain in effect (meaning this test can be used) for the duration of the COVID-19 declaration under Section 564(b)(1) of the Act, 21 U.S.C. section 360bbb-3(b)(1), unless the authorization is terminated or revoked.  Performed at [facility], [address] [place], [place] [address]      Labs: BNP (last 3 results) Recent Labs    04/24/21 1456  BNP [id number]   Basic Metabolic Panel: Recent Labs  Lab 04/24/21 1456 04/25/21 0455 04/26/21 0431  NA 136 136 140  K 3.5 3.8 3.6  CL 103 102 107  CO2 27 24 27   GLUCOSE 101* 211* 100*  BUN 12 12 11   CREATININE 0.70 0.58 0.63  CALCIUM 8.7* 8.8* 8.4*  MG  --   --  2.4   Liver Function Tests: Recent Labs  Lab 04/24/21 1456  AST 22  ALT 20  ALKPHOS 55  BILITOT 0.5  PROT 6.7  ALBUMIN 3.8   No results for input(s): LIPASE, AMYLASE in the last 168 hours. No results for input(s): AMMONIA in the last 168 hours. CBC: Recent Labs  Lab 04/24/21 1456 04/25/21 0455 04/26/21 0431 04/27/21 0516  WBC 4.2 3.1* 4.7 4.7  NEUTROABS 2.9  --   --   --   HGB 14.7 13.1 11.3* 14.0  HCT 43.9 39.2 34.3* 42.2  MCV 93.8 92.0 94.2 93.8  PLT 119* 122* 107* 145*   Cardiac Enzymes: No results for input(s): CKTOTAL, CKMB, CKMBINDEX, TROPONINI in the last 168 hours. BNP: Invalid input(s): POCBNP CBG: No results for input(s): GLUCAP in the last 168 hours. D-Dimer Recent Labs    04/24/21 2049  DDIMER 0.83*   Hgb A1c No  results for input(s): HGBA1C in the last 72 hours. Lipid Profile No results for input(s): CHOL, HDL, LDLCALC, TRIG, CHOLHDL, LDLDIRECT in the last 72 hours. Thyroid function  studies No results for input(s): TSH, T4TOTAL, T3FREE, THYROIDAB in the last 72 hours.  Invalid input(s): FREET3 Anemia work up No results for input(s): VITAMINB12, FOLATE, FERRITIN, TIBC, IRON, RETICCTPCT in the last 72 hours. Urinalysis No results found for: COLORURINE, APPEARANCEUR, [place], [place], GLUCOSEU, [place], [place], [place], PROTEINUR, UROBILINOGEN, NITRITE, LEUKOCYTESUR Sepsis Labs Invalid input(s): PROCALCITONIN,  WBC,  LACTICIDVEN Microbiology Recent Results (from the past 240 hour(s))  Resp Panel by RT-PCR (Flu A&B, Covid) Nasopharyngeal Swab     Status: None   Collection Time: 04/24/21  2:56 PM   Specimen: Nasopharyngeal Swab; Nasopharyngeal(NP) swabs in vial transport medium  Result Value Ref Range Status   SARS Coronavirus 2 by RT PCR NEGATIVE NEGATIVE Final    Comment: (NOTE) SARS-CoV-2 target nucleic acids are NOT DETECTED.  The SARS-CoV-2 RNA is generally detectable in upper respiratory specimens during the acute phase of infection. The lowest concentration of SARS-CoV-2 viral copies this assay can detect is 138 copies/mL. A negative result does not preclude SARS-Cov-2 infection and should not be used as the sole basis for treatment or other patient management decisions. A negative result may occur with  improper specimen collection/handling, submission of specimen other than nasopharyngeal swab, presence of viral mutation(s) within the areas targeted by this assay, and inadequate number of viral copies(<138 copies/mL). A negative result must be combined with clinical observations, patient history, and epidemiological information. The expected result is Negative.  Fact Sheet for Patients:  EntrepreneurPulse.com.au  Fact Sheet for Healthcare Providers:  IncredibleEmployment.be  This test is no t yet approved or cleared by the [place] FDA and  has been authorized for detection and/or diagnosis of SARS-CoV-2  by FDA under an Emergency Use Authorization (EUA). This EUA will remain  in effect (meaning this test can be used) for the duration of the COVID-19 declaration under Section 564(b)(1) of the Act, 21 U.S.C.section 360bbb-3(b)(1), unless the authorization is terminated  or revoked sooner.       Influenza A by PCR NEGATIVE NEGATIVE Final   Influenza B by PCR NEGATIVE NEGATIVE Final    Comment: (NOTE) The Xpert Xpress SARS-CoV-2/FLU/RSV plus assay is intended as an aid in the diagnosis of influenza from Nasopharyngeal swab specimens and should not be used as a sole basis for treatment. Nasal washings and aspirates are unacceptable for Xpert Xpress SARS-CoV-2/FLU/RSV testing.  Fact Sheet for Patients: EntrepreneurPulse.com.au  Fact Sheet for Healthcare Providers: IncredibleEmployment.be  This test is not yet approved or cleared by the [place] FDA and has been authorized for detection and/or diagnosis of SARS-CoV-2 by FDA under an Emergency Use Authorization (EUA). This EUA will remain in effect (meaning this test can be used) for the duration of the COVID-19 declaration under Section 564(b)(1) of the Act, 21 U.S.C. section 360bbb-3(b)(1), unless the authorization is terminated or revoked.  Performed at [facility], [address] [place], [place] [address]      Time coordinating discharge: Over 30 minutes  SIGNED:   Nolberto Hanlon, MD  [facility] 04/27/2021, 11:26 AM Pager   If 7PM-7AM, please contact night-coverage [email or web address] Password [email or web address]

## 2021-04-27 NOTE — TOC Progression Note (Signed)
Transition of Care [facility]) - Progression Note    Patient Details  Name: Grace King MRN: [id number] Date of Birth: [birth year]  [facility]) CM/SW Contact  Allayne Butcher, RN Phone Number: 04/27/2021, 9:37 AM  Clinical Narrative:     Patient qualifies for oxygen at home.  Oxygen ordered from [employer] and should be delivered in the next 2 hours.  Patient already received RW from [employer] yesterday.   Expected Discharge Plan: Home/Self Care Barriers to Discharge: Continued Medical Work up  Expected Discharge Plan and Services Expected Discharge Plan: Home/Self Care   Discharge Planning Services: CM Consult   Living arrangements for the past 2 months: Single Family Home                 DME Arranged: Oxygen, Walker rolling DME Agency: [employer] Date DME Agency Contacted: 04/27/21 Time DME Agency Contacted: [phone] Representative spoke with at DME Agency: [employer] main number HH Arranged: NA HH Agency: NA         Social Determinants of Health (SDOH) Interventions    Readmission Risk Interventions No flowsheet data found.

## 2021-07-08 ENCOUNTER — Inpatient Hospital Stay
Admission: EM | Admit: 2021-07-08 | Discharge: 2021-07-11 | DRG: 190 | Disposition: A | Discharge status: Home Health | Payer: Medicare HMO | Attending: Family Medicine | Admitting: Family Medicine

## 2021-07-08 ENCOUNTER — Emergency Department: Payer: Medicare HMO

## 2021-07-08 ENCOUNTER — Other Ambulatory Visit: Payer: Self-pay

## 2021-07-08 DIAGNOSIS — J189 Pneumonia, unspecified organism: Secondary | ICD-10-CM

## 2021-07-08 DIAGNOSIS — J439 Emphysema, unspecified: Principal | ICD-10-CM | POA: Diagnosis present

## 2021-07-08 DIAGNOSIS — Z889 Allergy status to unspecified drugs, medicaments and biological substances status: Secondary | ICD-10-CM

## 2021-07-08 DIAGNOSIS — Z91018 Allergy to other foods: Secondary | ICD-10-CM

## 2021-07-08 DIAGNOSIS — Z90710 Acquired absence of both cervix and uterus: Secondary | ICD-10-CM

## 2021-07-08 DIAGNOSIS — F17210 Nicotine dependence, cigarettes, uncomplicated: Secondary | ICD-10-CM | POA: Diagnosis present

## 2021-07-08 DIAGNOSIS — Z716 Tobacco abuse counseling: Secondary | ICD-10-CM

## 2021-07-08 DIAGNOSIS — M5417 Radiculopathy, lumbosacral region: Secondary | ICD-10-CM | POA: Diagnosis present

## 2021-07-08 DIAGNOSIS — Z880 Allergy status to penicillin: Secondary | ICD-10-CM

## 2021-07-08 DIAGNOSIS — R0602 Shortness of breath: Secondary | ICD-10-CM | POA: Diagnosis not present

## 2021-07-08 DIAGNOSIS — A419 Sepsis, unspecified organism: Secondary | ICD-10-CM | POA: Diagnosis present

## 2021-07-08 DIAGNOSIS — Z91040 Latex allergy status: Secondary | ICD-10-CM

## 2021-07-08 DIAGNOSIS — Z681 Body mass index (BMI) 19 or less, adult: Secondary | ICD-10-CM

## 2021-07-08 DIAGNOSIS — Z888 Allergy status to other drugs, medicaments and biological substances status: Secondary | ICD-10-CM

## 2021-07-08 DIAGNOSIS — Z9981 Dependence on supplemental oxygen: Secondary | ICD-10-CM

## 2021-07-08 DIAGNOSIS — K2960 Other gastritis without bleeding: Secondary | ICD-10-CM | POA: Diagnosis present

## 2021-07-08 DIAGNOSIS — Z885 Allergy status to narcotic agent status: Secondary | ICD-10-CM

## 2021-07-08 DIAGNOSIS — J441 Chronic obstructive pulmonary disease with (acute) exacerbation: Secondary | ICD-10-CM

## 2021-07-08 DIAGNOSIS — B349 Viral infection, unspecified: Secondary | ICD-10-CM | POA: Diagnosis present

## 2021-07-08 DIAGNOSIS — J9601 Acute respiratory failure with hypoxia: Secondary | ICD-10-CM | POA: Diagnosis present

## 2021-07-08 DIAGNOSIS — E440 Moderate protein-calorie malnutrition: Secondary | ICD-10-CM | POA: Insufficient documentation

## 2021-07-08 DIAGNOSIS — Z20822 Contact with and (suspected) exposure to covid-19: Secondary | ICD-10-CM | POA: Diagnosis present

## 2021-07-08 DIAGNOSIS — Y95 Nosocomial condition: Secondary | ICD-10-CM

## 2021-07-08 LAB — COMPREHENSIVE METABOLIC PANEL
ALT: 19 U/L (ref 0–44)
AST: 14 U/L — ABNORMAL LOW (ref 15–41)
Albumin: 3.5 g/dL (ref 3.5–5.0)
Alkaline Phosphatase: 63 U/L (ref 38–126)
Anion gap: 5 (ref 5–15)
BUN: 14 mg/dL (ref 6–20)
CO2: 24 mmol/L (ref 22–32)
Calcium: 9.1 mg/dL (ref 8.9–10.3)
Chloride: 112 mmol/L — ABNORMAL HIGH (ref 98–111)
Creatinine, Ser: 0.57 mg/dL (ref 0.44–1.00)
GFR, Estimated: >60 mL/min (ref >60)
Glucose, Bld: 125 mg/dL — ABNORMAL HIGH (ref 70–99)
Potassium: 3.6 mmol/L (ref 3.5–5.1)
Sodium: 141 mmol/L (ref 135–145)
Total Bilirubin: 0.6 mg/dL (ref 0.3–1.2)
Total Protein: 6.9 g/dL (ref 6.5–8.1)

## 2021-07-08 LAB — RESP PANEL BY RT-PCR (FLU A&B, COVID) ARPGX2
Influenza A by PCR: NEGATIVE
Influenza B by PCR: NEGATIVE
SARS Coronavirus 2 by RT PCR: NEGATIVE

## 2021-07-08 LAB — TROPONIN I (HIGH SENSITIVITY): Troponin I (High Sensitivity): 6 ng/L (ref <18)

## 2021-07-08 LAB — BRAIN NATRIURETIC PEPTIDE: B Natriuretic Peptide: 68.1 pg/mL (ref 0.0–100.0)

## 2021-07-08 MED ORDER — SODIUM CHLORIDE 0.9 % IV SOLN
500.0000 mg | Freq: Once | INTRAVENOUS | Status: AC
  Administered 2021-07-09: 500 mg via INTRAVENOUS
  Filled 2021-07-08: qty 5

## 2021-07-08 MED ORDER — SODIUM CHLORIDE 0.9 % IV BOLUS
500.0000 mL | Freq: Once | INTRAVENOUS | Status: AC
  Administered 2021-07-08: 500 mL via INTRAVENOUS

## 2021-07-08 MED ORDER — METHYLPREDNISOLONE SODIUM SUCC 125 MG IJ SOLR: 125.0000 mg | Freq: Once | INTRAMUSCULAR | Status: DC

## 2021-07-08 MED ORDER — SODIUM CHLORIDE 0.9 % IV SOLN
2.0000 g | Freq: Once | INTRAVENOUS | Status: AC
  Administered 2021-07-09: 2 g via INTRAVENOUS
  Filled 2021-07-08: qty 2

## 2021-07-08 MED ORDER — LACTATED RINGERS IV SOLN: INTRAVENOUS | Status: DC

## 2021-07-08 MED ORDER — ONDANSETRON HCL 4 MG/2ML IJ SOLN
4.0000 mg | Freq: Once | INTRAMUSCULAR | Status: AC
Start: 2021-07-08 — End: 2021-07-08
  Administered 2021-07-08: 4 mg via INTRAVENOUS
  Filled 2021-07-08: qty 2

## 2021-07-08 MED ORDER — IPRATROPIUM-ALBUTEROL 0.5-2.5 (3) MG/3ML IN SOLN
3.0000 mL | Freq: Once | RESPIRATORY_TRACT | Status: AC
  Administered 2021-07-08: 3 mL via RESPIRATORY_TRACT
  Filled 2021-07-08: qty 6

## 2021-07-08 MED ORDER — IPRATROPIUM-ALBUTEROL 0.5-2.5 (3) MG/3ML IN SOLN
3.0000 mL | Freq: Once | RESPIRATORY_TRACT | Status: AC
  Administered 2021-07-08: 3 mL via RESPIRATORY_TRACT

## 2021-07-08 NOTE — Progress Notes (Signed)
PHARMACY -  BRIEF ANTIBIOTIC NOTE   Pharmacy has received consult(s) for Cefepime from an ED provider.  The patient's profile has been reviewed for ht/wt/allergies/indication/available labs.    One time order(s) placed for Cefepime 2 gm IV X 1   Further antibiotics/pharmacy consults should be ordered by admitting physician if indicated.                       Thank you, Ninfa Giannelli D 07/08/2021  11:59 PM

## 2021-07-08 NOTE — ED Provider Notes (Incomplete)
----------------------------------------- °  11:03 PM on 07/08/2021 -----------------------------------------  Assuming care from Dr. Roxan Hockey.  In short, Grace King is a 51 y.o. female with a chief complaint of shortness of breath.  Refer to the original H&P for additional details.  The current plan of care is to admit for COPD exacerbation after workup is complete.   ***

## 2021-07-08 NOTE — ED Provider Notes (Signed)
[facility] Provider Note    Event Date/Time   First MD Initiated Contact with Patient 07/08/21 2217     (approximate)   History   Shortness of Breath   HPI  Grace King is a 51 y.o. female with a history of COPD on oxygen presents to the ER for worsening shortness of breath productive cough for the past 3 weeks.  States that she is coughing up green sputum.  She denies any chest pain.  Has been on a course of prednisone without any improvement feels like her nebulizer treatments are not helping at this point either.  Does feel nauseated no vomiting no diarrhea.     Physical Exam   Triage Vital Signs: ED Triage Vitals  Enc Vitals Group     BP 07/08/21 2208 (!) 157/107     Pulse Rate 07/08/21 2208 (!) 123     Resp 07/08/21 2208 (!) 24     Temp 07/08/21 2208 98.5 F (36.9 C)     Temp Source 07/08/21 2208 Oral     SpO2 07/08/21 2203 98 %     Weight 07/08/21 2211 118 lb (53.5 kg)     Height 07/08/21 2211 5\' 7"  (1.702 m)     Head Circumference --      Peak Flow --      Pain Score 07/08/21 2210 9     Pain Loc --      Pain Edu? --      Excl. in GC? --     Most recent vital signs: Vitals:   07/08/21 2203 07/08/21 2208  BP:  (!) 157/107  Pulse:  (!) 123  Resp:  (!) 24  Temp:  98.5 F (36.9 C)  SpO2: 98% 98%     Constitutional: Alert  Eyes: Conjunctivae are normal.  Head: Atraumatic. Nose: No congestion/rhinnorhea. Mouth/Throat: Mucous membranes are moist.   Neck: Painless ROM.  Cardiovascular:   Good peripheral circulation. Respiratory: No tachypnea with pursed lip breathing prolonged expiratory phase diminished breath sounds throughout.  No rhonchi noted. Gastrointestinal: Soft and nontender.  Musculoskeletal:  no deformity Neurologic:  MAE spontaneously. No gross focal neurologic deficits are appreciated.  Skin:  Skin is warm, dry and intact. No rash noted. Psychiatric: Mood and affect are normal. Speech and behavior are  normal.    ED Results / Procedures / Treatments   Labs (all labs ordered are listed, but only abnormal results are displayed) Labs Reviewed  COMPREHENSIVE METABOLIC PANEL - Abnormal; Notable for the following components:      Result Value   Chloride 112 (*)    Glucose, Bld 125 (*)    AST 14 (*)    All other components within normal limits  RESP PANEL BY RT-PCR (FLU A&B, COVID) ARPGX2  CULTURE, BLOOD (ROUTINE X 2)  CULTURE, BLOOD (ROUTINE X 2)  CULTURE, BLOOD (SINGLE)  BRAIN NATRIURETIC PEPTIDE  BLOOD GAS, VENOUS  LACTIC ACID, PLASMA  CBC  TROPONIN I (HIGH SENSITIVITY)     EKG  ED ECG REPORT I, 2209, the attending physician, personally viewed and interpreted this ECG.   Date: 07/08/2021  EKG Time: 22:11  Rate: 120  Rhythm: sinus  Axis: normal  Intervals:normal intervals  ST&T Change: no stemi    RADIOLOGY Please see ED Course for my review and interpretation.  I personally reviewed all radiographic images ordered to evaluate for the above acute complaints and reviewed radiology reports and findings.  These findings were personally discussed with the  patient.  Please see medical record for radiology report.    PROCEDURES:  Critical Care performed: Yes, see critical care procedure note(s)  .1-3 Lead EKG Interpretation Performed by: Willy Eddy, MD Authorized by: Willy Eddy, MD     Interpretation: abnormal     ECG rate:  120   ECG rate assessment: tachycardic     Rhythm: sinus rhythm   .Critical Care Performed by: Willy Eddy, MD Authorized by: Willy Eddy, MD   Critical care provider statement:    Critical care time (minutes):  35   Critical care was necessary to treat or prevent imminent or life-threatening deterioration of the following conditions:  Respiratory failure   Critical care was time spent personally by me on the following activities:  Ordering and performing treatments and interventions, ordering and  review of laboratory studies, ordering and review of radiographic studies, pulse oximetry, re-evaluation of patient's condition, review of old charts, obtaining history from patient or surrogate, examination of patient, evaluation of patient's response to treatment, discussions with primary provider, discussions with consultants and development of treatment plan with patient or surrogate   MEDICATIONS ORDERED IN ED: Medications  lactated ringers infusion (has no administration in time range)  ceFEPIme (MAXIPIME) 2 g in sodium chloride 0.9 % 100 mL IVPB (has no administration in time range)  azithromycin (ZITHROMAX) 500 mg in sodium chloride 0.9 % 250 mL IVPB (has no administration in time range)  sodium chloride 0.9 % bolus 500 mL (500 mLs Intravenous New Bag/Given 07/08/21 2232)  ondansetron (ZOFRAN) injection 4 mg (4 mg Intravenous Given 07/08/21 2232)  ipratropium-albuterol (DUONEB) 0.5-2.5 (3) MG/3ML nebulizer solution 3 mL (3 mLs Nebulization Given 07/08/21 2232)  ipratropium-albuterol (DUONEB) 0.5-2.5 (3) MG/3ML nebulizer solution 3 mL (3 mLs Nebulization Given 07/08/21 2232)     IMPRESSION / MDM / ASSESSMENT AND PLAN / ED COURSE  I reviewed the triage vital signs and the nursing notes.                              Differential diagnosis includes, but is not limited to, Asthma, copd, CHF, pna, ptx, malignancy, Pe, anemia  Patient arrives with shortness of breath history of COPD on home oxygen as described above.  Patient is protecting her air way but is tachypneic with diminished air movement throughout.  Will give nebulizers again to reassess for improvement.  May require BiPAP.  Blood work as well as x-ray imaging ordered for the blood differential.  Clinical Course as of 07/08/21 2335  Sat Jul 08, 2021  2303 Chest x-ray by my review does not show evidence of pneumothorax. [PR]  2309 Patient reassessed.  She still protecting her airway but still seems very tight after multiple nebs.  Will  trial BiPAP. [PR]  2334 Discussed the case with hospitalist who have kindly excepted patient to their service. [PR]    Clinical Course User Index [PR] Willy Eddy, MD     FINAL CLINICAL IMPRESSION(S) / ED DIAGNOSES   Final diagnoses:  COPD exacerbation (HCC)     Rx / DC Orders   ED Discharge Orders     None        Note:  This document was prepared using Dragon voice recognition software and may include unintentional dictation errors.    Willy Eddy, MD 07/08/21 [phone]

## 2021-07-08 NOTE — ED Triage Notes (Signed)
Pt has been coughing up green for the past 3 weeks but worsening over the last week. Pt shob tonight. Pt has used her rescue inhalers over the past week with no relief. Pt had video visit with PCP [facility] and was given prednisone. Pt has not improved

## 2021-07-08 NOTE — H&P (Addendum)
History and Physical    Patient: Grace King OIT:[id number] DOB: [birth year] DOA: 07/08/2021 DOS: the patient was seen and examined on 07/09/2021 PCP: Lesly Rubenstein, MD  Patient coming from: Home  Chief Complaint:  Chief Complaint  Patient presents with   Shortness of Breath    HPI: Grace King is a 51 y.o. female with medical history significant of sob since past three weeks.    Review of Systems: Review of Systems  Respiratory:  Positive for shortness of breath.   All other systems reviewed and are negative.  Past Medical History:  Diagnosis Date   Asthma    COPD (chronic obstructive pulmonary disease) (HCC)    Lupus (HCC)    Past Surgical History:  Procedure Laterality Date   ABDOMINAL HYSTERECTOMY     BACK SURGERY     Social History:  reports that she has been smoking cigarettes. She has never used smokeless tobacco. She reports that she does not drink alcohol and does not use drugs.  Allergies  Allergen Reactions   Codeine Itching    Pills   Cucumber Extract Anaphylaxis    Pickles    Diclofenac Potassium Hives   Duloxetine Rash   Hydrocodone-Acetaminophen Anaphylaxis   Oxycodone Itching   Pregabalin Hives and Rash   Shellfish Allergy Anaphylaxis   Erythromycin    Latex    Morphine Itching    Patient itched after administering 4 mg morphine   Other     pickles   Peanut-Containing Drug Products    Penicillins    Ketorolac Rash   Tramadol Rash   History reviewed. No pertinent family history.  Prior to Admission medications   Medication Sig Start Date End Date Taking? Authorizing Provider  albuterol (PROVENTIL) (2.5 MG/3ML) 0.083% nebulizer solution Inhale 3 mLs (2.5 mg total) into the lungs every 4 (four) hours as needed. 02/29/20 04/26/21  Marguerita Merles Latif, DO  budesonide-formoterol (SYMBICORT) 160-4.5 MCG/ACT inhaler Inhale 2 puffs into the lungs 2 (two) times daily.    [provider]  EPINEPHrine 0.3 mg/0.3 mL IJ SOAJ injection Inject 0.3  mLs (0.3 mg total) into the muscle as needed for anaphylaxis. 11/14/19   Don Perking, [place], MD  montelukast (SINGULAIR) 10 MG tablet Take 10 mg by mouth at bedtime.     [provider]  oxyCODONE-acetaminophen (PERCOCET/ROXICET) 5-325 MG tablet Take 1 tablet by mouth every 6 (six) hours as needed for severe pain.    [provider]  pantoprazole (PROTONIX) 40 MG tablet Take 40 mg by mouth 2 (two) times daily.    [provider]  SUMAtriptan (IMITREX) 50 MG tablet Take 50 mg by mouth daily as needed for migraine. (May repeat after 2 hours if needed)    [provider]  tiotropium (SPIRIVA HANDIHALER) 18 MCG inhalation capsule Place 18 mcg into inhaler and inhale daily.    [provider]  tiZANidine (ZANAFLEX) 4 MG tablet Take 4 mg by mouth 3 (three) times daily.    [provider]    Physical Exam: Vitals:   07/08/21 2208 07/08/21 2211 07/08/21 2230 07/08/21 2340  BP: (!) 157/107  (!) 148/126 (!) 158/84  Pulse: (!) 123  90 (!) 106  Resp: (!) 24  20 15   Temp: 98.5 F (36.9 C)     TempSrc: Oral     SpO2: 98%  92% 97%  Weight:  53.5 kg    Height:  5\' 7"  (1.702 m)    Physical Exam Vitals reviewed.  Constitutional:  General: She is not in acute distress.    Appearance: She is not ill-appearing.  HENT:     Head: Normocephalic and atraumatic.     Right Ear: External ear normal.     Left Ear: External ear normal.     Nose: Nose normal.     Mouth/Throat:     Mouth: Mucous membranes are moist.  Eyes:     Extraocular Movements: Extraocular movements intact.     Pupils: Pupils are equal, round, and reactive to light.  Neck:     Vascular: No carotid bruit.  Cardiovascular:     Rate and Rhythm: Normal rate and regular rhythm.     Pulses: Normal pulses.     Heart sounds: Normal heart sounds.  Pulmonary:     Effort: Pulmonary effort is normal.     Breath sounds: Normal breath sounds.  Abdominal:     General: Bowel sounds are  normal.     Palpations: Abdomen is soft.  Musculoskeletal:     Right lower leg: No edema.     Left lower leg: No edema.  Skin:    General: Skin is warm.  Neurological:     General: No focal deficit present.     Mental Status: She is alert and oriented to person, place, and time.  Psychiatric:        Mood and Affect: Mood normal.        Behavior: Behavior normal.   Data Reviewed: Labs shows cmp with glucose of 125. Normal lft. Cbc shows normal wbc hb platelet and eosinophil count.  Dimer is 0/43. Resp panel is negative.  Blood cultures collected.  EKG S.tach at 120.    Assessment and Plan: * SOB (shortness of breath)- (present on admission) Differentials for patient's shortness of breath includes GERD variant asthma, COPD, pneumonia. We will continue patient on supplemental oxygen with NIPPV as deemed appropriate. Pt is on home o2 from prior admission and does not know why  Has an  upcoming pulm appt.  We will continue solumedrol and duoneb.    Erosive gastritis- (present on admission) Iv ppi/ carafate.  Pt states she used to take ibuprofen and has not taken it is few   Advance Care Planning:   Code Status: Full Code full code   Consults: None   Family Communication: Bary Castilla (Daughter)  [phone] (Mobile)  Severity of Illness: The appropriate patient status for this patient is INPATIENT. Inpatient status is judged to be reasonable and necessary in order to provide the required intensity of service to ensure the patient's safety. The patient's presenting symptoms, physical exam findings, and initial radiographic and laboratory data in the context of their chronic comorbidities is felt to place them at high risk for further clinical deterioration. Furthermore, it is not anticipated that the patient will be medically stable for discharge from the hospital within 2 midnights of admission.   * I certify that at the point of admission it is my clinical judgment  that the patient will require inpatient hospital care spanning beyond 2 midnights from the point of admission due to high intensity of service, high risk for further deterioration and high frequency of surveillance required.*  Author: Gertha Calkin, MD 07/09/2021 12:44 AM  For on call review [email or web address].

## 2021-07-09 ENCOUNTER — Inpatient Hospital Stay (HOSPITAL_COMMUNITY)
Admit: 2021-07-09 | Discharge: 2021-07-09 | Disposition: A | Discharge status: Home / Self Care | Payer: Medicare HMO | Attending: Internal Medicine | Admitting: Internal Medicine

## 2021-07-09 DIAGNOSIS — Z91040 Latex allergy status: Secondary | ICD-10-CM | POA: Diagnosis not present

## 2021-07-09 DIAGNOSIS — Z90710 Acquired absence of both cervix and uterus: Secondary | ICD-10-CM | POA: Diagnosis not present

## 2021-07-09 DIAGNOSIS — R0602 Shortness of breath: Secondary | ICD-10-CM | POA: Diagnosis present

## 2021-07-09 DIAGNOSIS — Z91018 Allergy to other foods: Secondary | ICD-10-CM | POA: Diagnosis not present

## 2021-07-09 DIAGNOSIS — E440 Moderate protein-calorie malnutrition: Secondary | ICD-10-CM | POA: Diagnosis present

## 2021-07-09 DIAGNOSIS — F17210 Nicotine dependence, cigarettes, uncomplicated: Secondary | ICD-10-CM | POA: Diagnosis present

## 2021-07-09 DIAGNOSIS — M5417 Radiculopathy, lumbosacral region: Secondary | ICD-10-CM | POA: Diagnosis present

## 2021-07-09 DIAGNOSIS — K2960 Other gastritis without bleeding: Secondary | ICD-10-CM | POA: Diagnosis present

## 2021-07-09 DIAGNOSIS — Z888 Allergy status to other drugs, medicaments and biological substances status: Secondary | ICD-10-CM | POA: Diagnosis not present

## 2021-07-09 DIAGNOSIS — Z716 Tobacco abuse counseling: Secondary | ICD-10-CM | POA: Diagnosis not present

## 2021-07-09 DIAGNOSIS — Z9981 Dependence on supplemental oxygen: Secondary | ICD-10-CM | POA: Diagnosis not present

## 2021-07-09 DIAGNOSIS — Z20822 Contact with and (suspected) exposure to covid-19: Secondary | ICD-10-CM | POA: Diagnosis present

## 2021-07-09 DIAGNOSIS — B349 Viral infection, unspecified: Secondary | ICD-10-CM | POA: Diagnosis present

## 2021-07-09 DIAGNOSIS — J9601 Acute respiratory failure with hypoxia: Secondary | ICD-10-CM | POA: Diagnosis present

## 2021-07-09 DIAGNOSIS — A419 Sepsis, unspecified organism: Secondary | ICD-10-CM | POA: Diagnosis present

## 2021-07-09 DIAGNOSIS — J439 Emphysema, unspecified: Secondary | ICD-10-CM | POA: Diagnosis present

## 2021-07-09 DIAGNOSIS — Z880 Allergy status to penicillin: Secondary | ICD-10-CM | POA: Diagnosis not present

## 2021-07-09 DIAGNOSIS — Z681 Body mass index (BMI) 19 or less, adult: Secondary | ICD-10-CM | POA: Diagnosis not present

## 2021-07-09 DIAGNOSIS — Z885 Allergy status to narcotic agent status: Secondary | ICD-10-CM | POA: Diagnosis not present

## 2021-07-09 LAB — CBC WITH DIFFERENTIAL/PLATELET
Abs Immature Granulocytes: 0.09 10*3/uL — ABNORMAL HIGH (ref 0.00–0.07)
Basophils Absolute: 0 10*3/uL (ref 0.0–0.1)
Basophils Relative: 0 %
Eosinophils Absolute: 0 10*3/uL (ref 0.0–0.5)
Eosinophils Relative: 0 %
HCT: 38.6 % (ref 36.0–46.0)
Hemoglobin: 12.5 g/dL (ref 12.0–15.0)
Immature Granulocytes: 1 %
Lymphocytes Relative: 7 %
Lymphs Abs: 0.7 10*3/uL (ref 0.7–4.0)
MCH: 30.9 pg (ref 26.0–34.0)
MCHC: 32.4 g/dL (ref 30.0–36.0)
MCV: 95.5 fL (ref 80.0–100.0)
Monocytes Absolute: 0.4 10*3/uL (ref 0.1–1.0)
Monocytes Relative: 4 %
Neutro Abs: 8.1 10*3/uL — ABNORMAL HIGH (ref 1.7–7.7)
Neutrophils Relative %: 88 %
Platelets: 186 10*3/uL (ref 150–400)
RBC: 4.04 MIL/uL (ref 3.87–5.11)
RDW: 13.4 % (ref 11.5–15.5)
WBC: 9.3 10*3/uL (ref 4.0–10.5)
nRBC: 0 % (ref 0.0–0.2)

## 2021-07-09 LAB — BASIC METABOLIC PANEL
Anion gap: 7 (ref 5–15)
BUN: 13 mg/dL (ref 6–20)
CO2: 26 mmol/L (ref 22–32)
Calcium: 9.4 mg/dL (ref 8.9–10.3)
Chloride: 107 mmol/L (ref 98–111)
Creatinine, Ser: 0.57 mg/dL (ref 0.44–1.00)
GFR, Estimated: >60 mL/min (ref >60)
Glucose, Bld: 160 mg/dL — ABNORMAL HIGH (ref 70–99)
Potassium: 4.4 mmol/L (ref 3.5–5.1)
Sodium: 140 mmol/L (ref 135–145)

## 2021-07-09 LAB — RESPIRATORY PANEL BY PCR

## 2021-07-09 LAB — CBC
HCT: 37.6 % (ref 36.0–46.0)
Hemoglobin: 12.2 g/dL (ref 12.0–15.0)
MCH: 30.4 pg (ref 26.0–34.0)
MCHC: 32.4 g/dL (ref 30.0–36.0)
MCV: 93.8 fL (ref 80.0–100.0)
Platelets: 167 10*3/uL (ref 150–400)
RBC: 4.01 MIL/uL (ref 3.87–5.11)
RDW: 13.3 % (ref 11.5–15.5)
WBC: 6.9 10*3/uL (ref 4.0–10.5)
nRBC: 0 % (ref 0.0–0.2)

## 2021-07-09 LAB — BLOOD GAS, VENOUS
Acid-base deficit: 0.7 mmol/L (ref 0.0–2.0)
Bicarbonate: 23.2 mmol/L (ref 20.0–28.0)
FIO2: 0.28
O2 Saturation: 98.3 %
Patient temperature: 37
pCO2, Ven: 35 mmHg — ABNORMAL LOW (ref 44.0–60.0)
pH, Ven: 7.43 (ref 7.250–7.430)
pO2, Ven: 108 mmHg — ABNORMAL HIGH (ref 32.0–45.0)

## 2021-07-09 LAB — ECHOCARDIOGRAM COMPLETE
AR max vel: 1.45 cm2
AV Peak grad: 7.4 mmHg
Ao pk vel: 1.36 m/s
Area-P 1/2: 4.96 cm2
Height: 67 in
S' Lateral: 3.4 cm
Weight: 1888 oz

## 2021-07-09 LAB — TROPONIN I (HIGH SENSITIVITY): Troponin I (High Sensitivity): 6 ng/L (ref <18)

## 2021-07-09 LAB — D-DIMER, QUANTITATIVE: D-Dimer, Quant: 0.43 ug/mL-FEU (ref 0.00–0.50)

## 2021-07-09 LAB — LACTIC ACID, PLASMA: Lactic Acid, Venous: 1.4 mmol/L (ref 0.5–1.9)

## 2021-07-09 LAB — HIV ANTIBODY (ROUTINE TESTING W REFLEX): HIV Screen 4th Generation wRfx: NONREACTIVE

## 2021-07-09 MED ORDER — LACTATED RINGERS IV SOLN: INTRAVENOUS | Status: DC

## 2021-07-09 MED ORDER — MONTELUKAST SODIUM 10 MG PO TABS
10.0000 mg | ORAL_TABLET | Freq: Every day | ORAL | Status: DC
  Administered 2021-07-09 – 2021-07-10 (×3): 10 mg via ORAL
  Filled 2021-07-09 (×3): qty 1

## 2021-07-09 MED ORDER — TIOTROPIUM BROMIDE MONOHYDRATE 18 MCG IN CAPS
18.0000 ug | ORAL_CAPSULE | Freq: Every day | RESPIRATORY_TRACT | Status: DC
  Administered 2021-07-09 – 2021-07-11 (×3): 18 ug via RESPIRATORY_TRACT
  Filled 2021-07-09: qty 5

## 2021-07-09 MED ORDER — HYDRALAZINE HCL 20 MG/ML IJ SOLN
5.0000 mg | INTRAMUSCULAR | Status: DC | PRN
  Administered 2021-07-09: 5 mg via INTRAVENOUS
  Filled 2021-07-09: qty 1

## 2021-07-09 MED ORDER — OXYCODONE-ACETAMINOPHEN 5-325 MG PO TABS
1.0000 | ORAL_TABLET | Freq: Four times a day (QID) | ORAL | Status: DC | PRN
  Administered 2021-07-09 – 2021-07-11 (×9): 1 via ORAL
  Filled 2021-07-09 (×9): qty 1

## 2021-07-09 MED ORDER — SUMATRIPTAN SUCCINATE 50 MG PO TABS
50.0000 mg | ORAL_TABLET | Freq: Every day | ORAL | Status: DC | PRN
  Filled 2021-07-09: qty 1

## 2021-07-09 MED ORDER — IPRATROPIUM-ALBUTEROL 0.5-2.5 (3) MG/3ML IN SOLN
3.0000 mL | Freq: Three times a day (TID) | RESPIRATORY_TRACT | Status: DC
  Administered 2021-07-09 (×2): 3 mL via RESPIRATORY_TRACT
  Filled 2021-07-09 (×2): qty 3

## 2021-07-09 MED ORDER — DEXTROMETHORPHAN POLISTIREX ER 30 MG/5ML PO SUER
15.0000 mg | Freq: Two times a day (BID) | ORAL | Status: DC
  Filled 2021-07-09 (×2): qty 5

## 2021-07-09 MED ORDER — METHYLPREDNISOLONE SODIUM SUCC 40 MG IJ SOLR
40.0000 mg | Freq: Two times a day (BID) | INTRAMUSCULAR | Status: AC
  Administered 2021-07-09 – 2021-07-10 (×3): 40 mg via INTRAVENOUS
  Filled 2021-07-09 (×4): qty 1

## 2021-07-09 MED ORDER — ONDANSETRON HCL 4 MG/2ML IJ SOLN
4.0000 mg | Freq: Four times a day (QID) | INTRAMUSCULAR | Status: DC | PRN
  Administered 2021-07-09 – 2021-07-10 (×4): 4 mg via INTRAVENOUS
  Filled 2021-07-09 (×5): qty 2

## 2021-07-09 MED ORDER — FLUTICASONE FUROATE-VILANTEROL 200-25 MCG/ACT IN AEPB
1.0000 | INHALATION_SPRAY | Freq: Every day | RESPIRATORY_TRACT | Status: DC
  Administered 2021-07-09 – 2021-07-11 (×3): 1 via RESPIRATORY_TRACT
  Filled 2021-07-09: qty 28

## 2021-07-09 MED ORDER — DEXTROMETHORPHAN POLISTIREX ER 30 MG/5ML PO SUER
15.0000 mg | Freq: Two times a day (BID) | ORAL | Status: DC
  Administered 2021-07-09 – 2021-07-11 (×5): 15 mg via ORAL
  Filled 2021-07-09: qty 2.5
  Filled 2021-07-09: qty 5
  Filled 2021-07-09 (×4): qty 2.5
  Filled 2021-07-09: qty 5

## 2021-07-09 MED ORDER — IPRATROPIUM-ALBUTEROL 0.5-2.5 (3) MG/3ML IN SOLN
3.0000 mL | Freq: Two times a day (BID) | RESPIRATORY_TRACT | Status: DC
  Administered 2021-07-09 – 2021-07-11 (×4): 3 mL via RESPIRATORY_TRACT
  Filled 2021-07-09 (×4): qty 3

## 2021-07-09 MED ORDER — PANTOPRAZOLE SODIUM 40 MG IV SOLR
40.0000 mg | INTRAVENOUS | Status: DC
  Administered 2021-07-09 – 2021-07-11 (×3): 40 mg via INTRAVENOUS
  Filled 2021-07-09 (×3): qty 40

## 2021-07-09 MED ORDER — DOXYCYCLINE HYCLATE 100 MG PO TABS
100.0000 mg | ORAL_TABLET | Freq: Two times a day (BID) | ORAL | Status: DC
  Administered 2021-07-09 – 2021-07-11 (×5): 100 mg via ORAL
  Filled 2021-07-09 (×5): qty 1

## 2021-07-09 MED ORDER — DEXTROMETHORPHAN POLISTIREX ER 30 MG/5ML PO SUER: 15.0000 mg | Freq: Two times a day (BID) | ORAL | Status: DC

## 2021-07-09 MED ORDER — ORAL CARE MOUTH RINSE
15.0000 mL | Freq: Two times a day (BID) | OROMUCOSAL | Status: DC
  Administered 2021-07-09 – 2021-07-11 (×4): 15 mL via OROMUCOSAL

## 2021-07-09 MED ORDER — ALBUTEROL SULFATE (2.5 MG/3ML) 0.083% IN NEBU: 2.5000 mg | INHALATION_SOLUTION | RESPIRATORY_TRACT | Status: DC | PRN

## 2021-07-09 MED ORDER — HEPARIN SODIUM (PORCINE) 5000 UNIT/ML IJ SOLN
5000.0000 [IU] | Freq: Three times a day (TID) | INTRAMUSCULAR | Status: DC
  Administered 2021-07-09 – 2021-07-11 (×8): 5000 [IU] via SUBCUTANEOUS
  Filled 2021-07-09 (×7): qty 1

## 2021-07-09 MED ORDER — GUAIFENESIN ER 600 MG PO TB12
600.0000 mg | ORAL_TABLET | Freq: Two times a day (BID) | ORAL | Status: DC | PRN
  Administered 2021-07-09 – 2021-07-11 (×2): 600 mg via ORAL
  Filled 2021-07-09 (×2): qty 1

## 2021-07-09 MED ORDER — SODIUM CHLORIDE 0.9% FLUSH
3.0000 mL | Freq: Two times a day (BID) | INTRAVENOUS | Status: DC
  Administered 2021-07-09 – 2021-07-11 (×6): 3 mL via INTRAVENOUS

## 2021-07-09 NOTE — ED Notes (Signed)
Pt unable to tolerate due to coughing making her vomit.

## 2021-07-09 NOTE — Progress Notes (Signed)
°   07/09/21 1800  Clinical Encounter Type  Visited With Patient  Visit Type Initial  Referral From Nurse  Consult/Referral To Chaplain  Recommendations prayer, emotional support   Chaplain responded to on call page. Chaplain provided emotional and spiritual support. Chaplain provided compassionate non-anxious presence and reflective listening. Chaplain also provided AD paperwork as requested. Chaplain told patient that the on call Chaplain tomorrow will follow up with AD paperwork. Patient appreciated [place] visit.

## 2021-07-09 NOTE — Assessment & Plan Note (Addendum)
Differentials for patient's shortness of breath includes GERD variant asthma, COPD, pneumonia. We will continue patient on supplemental oxygen with NIPPV as deemed appropriate. Pt is on home o2 from prior admission and does not know why  Has an  upcoming pulm appt.  We will continue solumedrol and duoneb.

## 2021-07-09 NOTE — Assessment & Plan Note (Signed)
Iv ppi/ carafate.  Pt states she used to take ibuprofen and has not taken it is few

## 2021-07-09 NOTE — Progress Notes (Signed)
Patient placed on Bipap at 0045. RN notified this RT, patient cannot tolerate and has taken off Bipap. Placed back on [place] at this time.

## 2021-07-09 NOTE — Progress Notes (Signed)
*  PRELIMINARY RESULTS* Echocardiogram 2D Echocardiogram has been performed.  Grace King 07/09/2021, 9:07 AM

## 2021-07-09 NOTE — Progress Notes (Signed)
PROGRESS NOTE   Grace King  VQM:[id number] DOB: 1970-11-18 DOA: 07/08/2021 PCP: Lesly Rubenstein, MD  Brief Narrative:   54 y asthma/COPD--on 2 LOxygen since 04/2021, underlying Lupus, migraines, TCP in the past, lumbosacral radiculitis in the past with L3-L4 stenosis [pending pain referral] ? Emphysema diag 04/2021  recent OV telemed visit [facility]--Rx Medrol dosepak  Present [facility] Ed-coughing green--use Rescue, no relief--  Na 141 k 3.6 cl 113 bun/cr 14/0.6 Placed on bipap  Cxr no Pneumothor Echo canceled as it was felt that the patient had more a COPD/emphysema  She did not ultimately require BiPAP but we are monitoring her closely  Hospital-Problem based course  Acute hypoxic resopiratory failure 2/2 ? Viral illness/flu like illness grandchild was sick ~ 3 weeks ago Get RVP Cont doxycycline [hold iv abx as no PNA], cont Solumedrol 40 IV q12 for now until she resolves a little bit more Cont albuterol q 4, Breo Ellipta qd, Spiriva 19 qd, singulair 10 Rpt CXR in am--R/o PNA Echocardiogram ordered to rule out?  Heart failure component-cancelled echocardiogram as no s/sym HF ?  Lupus Patient has been told in the past that she has lupus-I see no lab indications for this-I will get a double-stranded DNA in the morning and if this is negative we can effectively rule this out as a diagnosis Still smoker, emphysema She is down to 2 cigarettes-we counseled her-she is unwilling to use Chantix or Wellbutrin because she is had insomnia from these things  she does seem to be an anxious lady so we will ask chaplain to come and help her with talk through things Lumbosacral radiculitis L3-L4-being referred to pain management per PCP last OV Avoid narcotics, avoid muscle relaxants-consider Cymbalta?  Consider tramadol Patient will need dedicated outpatient exercise/therapy  DVT prophylaxis: Heparin Code Status: Full Family Communication: No family present at this time Disposition:  Status is:  Inpatient Remains inpatient appropriate because: Short of breath  Consultants:  none  Procedures: no  Antimicrobials: narrowed to Doxy   Subjective: Awake coherent Some nasal flare- no accessory muscle use Had paroxsym of SOB after coughing We ordered delsym She is unable to completely quit smoking She has no cp nor fever She has no LE swelling She has 9 grandchildren--1 was sick ~ 3 weeks ago  Objective: Vitals:   07/09/21 0100 07/09/21 0147 07/09/21 0225 07/09/21 0500  BP: (!) 163/94 (!) 167/115    Pulse: (!) 114 (!) 103 (!) 108   Resp: 18 20 20    Temp: 98.8 F (37.1 C) (!) 97.5 F (36.4 C)    TempSrc:  Oral    SpO2: 97% 96% 97% 98%  Weight:      Height:        Intake/Output Summary (Last 24 hours) at 07/09/2021 0829 Last data filed at 07/09/2021 0217 Gross per 24 hour  Intake 730 ml  Output --  Net 730 ml   Filed Weights   07/08/21 2211  Weight: 53.5 kg    Examination:  Eomi ncat no focal deficit, mild wheeze no rales/rhonchi Cta b otherwise Talking in full sentences Abd soft nt nd no rebound no guarding No LE edema--cannot really appreciate any JVD She is comfortable laying flat without orthopnea  Data Reviewed: personally reviewed   CBC    Component Value Date/Time   WBC 6.9 07/09/2021 0544   RBC 4.01 07/09/2021 0544   HGB 12.2 07/09/2021 0544   HCT 37.6 07/09/2021 0544   PLT 167 07/09/2021 0544   MCV 93.8 07/09/2021  0544   MCH 30.4 07/09/2021 0544   MCHC 32.4 07/09/2021 0544   RDW 13.3 07/09/2021 0544   LYMPHSABS 0.7 07/08/2021 0016   MONOABS 0.4 07/08/2021 0016   EOSABS 0.0 07/08/2021 0016   BASOSABS 0.0 07/08/2021 0016   CMP Latest Ref Rng & Units 07/09/2021 07/08/2021 04/26/2021  Glucose 70 - 99 mg/dL 967(E) 938(B) 017(P)  BUN 6 - 20 mg/dL 13 14 11   Creatinine 0.44 - 1.00 mg/dL 1.02 5.85  Sodium 135 - 145 mmol/L 140 141 140  Potassium 3.5 - 5.1 mmol/L 4.4 3.6 3.6  Chloride 98 - 111 mmol/L 107 112(H) 107  CO2 22 - 32 mmol/L 26 24  27   Calcium 8.9 - 10.3 mg/dL 9.4 9.1 2.77)  Total Protein 6.5 - 8.1 g/dL - 6.9 -  Total Bilirubin 0.3 - 1.2 mg/dL - 0.6 -  Alkaline Phos 38 - 126 U/L - 63 -  AST 15 - 41 U/L - 14(L) -  ALT 0 - 44 U/L - 19 -     Radiology Studies: DG Chest Portable 1 View  Result Date: 07/08/2021 CLINICAL DATA:  Shortness of breath.  Question pulmonary infiltrate. EXAM: PORTABLE CHEST 1 VIEW COMPARISON:  04/24/2021 FINDINGS: Heart size is normal. Mediastinal shadows are normal. There is advanced emphysema. There may be hazy pneumonia in the lower lobes, but there is no dense consolidation, lobar collapse or visible effusion. Consider two-view chest radiography for more accurate evaluation. IMPRESSION: Underlying severe chronic emphysema. Cannot rule out hazy pneumonia in either lower lobe. Consider two-view chest radiography for better evaluation. Apparel should also be removed if the images are repeated. Electronically Signed   By: 09/05/2021 M.D.   On: 07/08/2021 22:59     Scheduled Meds:  fluticasone furoate-vilanterol  1 puff Inhalation Daily   heparin  5,000 Units Subcutaneous Q8H   ipratropium-albuterol  3 mL Nebulization Q8H   mouth rinse  15 mL Mouth Rinse BID   methylPREDNISolone (SOLU-MEDROL) injection  40 mg Intravenous Q12H   montelukast  10 mg Oral QHS   pantoprazole (PROTONIX) IV  40 mg Intravenous Q24H   sodium chloride flush  3 mL Intravenous Q12H   tiotropium  18 mcg Inhalation Daily   Continuous Infusions:  lactated ringers     lactated ringers 50 mL/hr at 07/09/21 0204     LOS: 0 days   Time spent: 81  09/06/21, MD [facility] To contact the attending provider between 7A-7P or the covering provider during after hours 7P-7A, please log into the web site [email or web address] and access using universal [place] password for that web site. If you do not have the password, please call the hospital operator.  07/09/2021, 8:29 AM

## 2021-07-09 NOTE — Progress Notes (Signed)
°  Transition of Care [facility]) Screening Note   Patient Details  Name: Grace King Date of Birth: [birth year]   Transition of Care [facility]) CM/SW Contact:    Gildardo Griffes, LCSW Phone Number: 07/09/2021, 9:32 AM  Patient on 2L O2 at baseline. TOC consulted for heart failure screen, heart failure RN Jimsey made aware.   Transition of Care Department [facility]) has reviewed patient and no TOC needs have been identified at this time. We will continue to monitor patient advancement through interdisciplinary progression rounds. If new patient transition needs arise, please place a TOC consult.  [place], [place] [phone]

## 2021-07-10 ENCOUNTER — Inpatient Hospital Stay: Payer: Medicare HMO

## 2021-07-10 DIAGNOSIS — R0602 Shortness of breath: Secondary | ICD-10-CM | POA: Diagnosis not present

## 2021-07-10 MED ORDER — ALBUTEROL SULFATE (2.5 MG/3ML) 0.083% IN NEBU
2.5000 mg | INHALATION_SOLUTION | RESPIRATORY_TRACT | Status: AC
  Administered 2021-07-10 (×2): 2.5 mg via RESPIRATORY_TRACT
  Filled 2021-07-10 (×2): qty 3

## 2021-07-10 MED ORDER — ENSURE ENLIVE PO LIQD
237.0000 mL | Freq: Three times a day (TID) | ORAL | Status: DC
  Administered 2021-07-10 – 2021-07-11 (×3): 237 mL via ORAL

## 2021-07-10 MED ORDER — AMLODIPINE BESYLATE 5 MG PO TABS
5.0000 mg | ORAL_TABLET | Freq: Every day | ORAL | Status: DC
  Administered 2021-07-10 – 2021-07-11 (×2): 5 mg via ORAL
  Filled 2021-07-10 (×2): qty 1

## 2021-07-10 MED ORDER — ADULT MULTIVITAMIN W/MINERALS CH
1.0000 | ORAL_TABLET | Freq: Every day | ORAL | Status: DC
  Administered 2021-07-10: 1 via ORAL
  Filled 2021-07-10: qty 1

## 2021-07-10 NOTE — TOC Initial Note (Addendum)
Transition of Care [facility]) - Initial/Assessment Note    Patient Details  Name: Grace King MRN: [id number] Date of Birth: 07-Mar-1971  Transition of Care [facility]) CM/SW Contact:    Alberteen Sam, LCSW Phone Number: 07/10/2021, 2:32 PM  Clinical Narrative:                  CSW met with patient at bedside to discuss Home health recs, patient is agreeable with no preference of agency.   Patient reports she has a walker at home but could utilize a tub bench, will order to have delivered to hospital room through [place].   Patient reports having home O2 through [employer] but having empty tanks as no one has been out since November 2022, CSW has asked Thedore Mins with [employer] to follow up.   Home Health referral sent to [facility] with [place], pending acceptance at this time.   Daughter to transport home at time of discharge.   Expected Discharge Plan: [place] Barriers to Discharge: Continued Medical Work up   Patient Goals and CMS Choice Patient states their goals for this hospitalization and ongoing recovery are:: to go home CMS Medicare.gov Compare Post Acute Care list provided to:: Patient Choice offered to / list presented to : Patient  Expected Discharge Plan and Services Expected Discharge Plan: [place]                         DME Arranged: Tub bench, Oxygen DME Agency: [employer] Date DME Agency Contacted: 07/10/21     HH Arranged: PT [place] Agency: [place] Date [employer] [place] Agency Contacted: 07/10/21 Time [place]: 7341 Representative spoke with at [place]: Tommi Rumps  Prior Living Arrangements/Services   Lives with:: Self   Do you feel safe going back to the place where you live?: Yes               Activities of Daily Living Home Assistive Devices/Equipment: Cane (specify quad or straight) ADL Screening (condition at time of admission) Patient's cognitive ability adequate to safely complete daily activities?: Yes Is the patient  deaf or have difficulty hearing?: No Does the patient have difficulty seeing, even when wearing glasses/contacts?: No Does the patient have difficulty concentrating, remembering, or making decisions?: No Patient able to express need for assistance with ADLs?: Yes Does the patient have difficulty dressing or bathing?: No Independently performs ADLs?: Yes (appropriate for developmental age) Does the patient have difficulty walking or climbing stairs?: No Weakness of Legs: None Weakness of Arms/Hands: None  Permission Sought/Granted                  Emotional Assessment Appearance:: Appears stated age     Orientation: : Oriented to Self, Oriented to Place, Oriented to  Time, Oriented to Situation Alcohol / Substance Use: Not Applicable Psych Involvement: No (comment)  Admission diagnosis:  SOB (shortness of breath) [R06.02] COPD exacerbation ([place]) [J44.1] Patient Active Problem List   Diagnosis Date Noted   Sepsis ([place]) 07/09/2021   SOB (shortness of breath) 07/08/2021   Multiple allergies 07/08/2021   COPD exacerbation ([place]) 02/28/2020   Erosive gastritis 03/01/2015   PCP:  Annice Needy, MD Pharmacy:   [facility] [phone] [facility] [place] - [place] [facility] [address] [place] [facility] [place] [address] Phone: [phone] Fax: [phone]     Social Determinants of Health (SDOH) Interventions  Readmission Risk Interventions No flowsheet data found.

## 2021-07-10 NOTE — Evaluation (Signed)
Physical Therapy Evaluation Patient Details Name: Grace King MRN: [id number] DOB: 1971/03/21 Today's Date: 07/10/2021  History of Present Illness  Grace King is a 51 y.o. female with medical history significant of sob since past three weeks. Patient diagnosed with emphysema in November. Reports she has Lupus.   Clinical Impression  Patient received in bed, leaning forward over pillows due to SOB. She is agreeable to PT assessment. Patient is independent with bed mobility and supervision for transfers and ambulation in room. Limited by HR and sob. Coughing during session. Patient will continue to benefit from skilled PT while here to improve activity tolerance and ensure safety with stairs for home entrance.        Recommendations for follow up therapy are one component of a multi-disciplinary discharge planning process, led by the attending physician.  Recommendations may be updated based on patient status, additional functional criteria and insurance authorization.  Follow Up Recommendations Home health PT    Assistance Recommended at Discharge None  Patient can return home with the following  A little help with walking and/or transfers;Help with stairs or ramp for entrance    Equipment Recommendations None recommended by PT  Recommendations for Other Services       Functional Status Assessment       Precautions / Restrictions Precautions Precaution Comments: mod fall Restrictions Weight Bearing Restrictions: No      Mobility  Bed Mobility Overal bed mobility: Independent                  Transfers Overall transfer level: Independent                      Ambulation/Gait Ambulation/Gait assistance: Supervision Gait Distance (Feet): 20 Feet Assistive device: None Gait Pattern/deviations: Step-through pattern Gait velocity: decreased     General Gait Details: patient is requiring supervision only for ambulation. Limited by respiratory status. O2  sats remained at 93% or greater with mobility, HR up to 132. Reports 8.5/10 on dyspnea scale.  Stairs            Wheelchair Mobility    Modified Rankin (Stroke Patients Only)       Balance Overall balance assessment: Mild deficits observed, not formally tested                                           Pertinent Vitals/Pain Pain Assessment Pain Assessment: Faces Faces Pain Scale: Hurts a little bit Pain Location: back- chronic Pain Descriptors / Indicators: Discomfort Pain Intervention(s): Monitored during session    Home Living Family/patient expects to be discharged to:: Private residence Living Arrangements: Children Available Help at Discharge: Family;Available PRN/intermittently Type of Home: Apartment Home Access: Stairs to enter   Entrance Stairs-Number of Steps: flight   Home Layout: One level Home Equipment: Agricultural consultant (2 wheels)      Prior Function Prior Level of Function : Independent/Modified Independent             Mobility Comments: patient had been working, not currently working. Does not go out much because she has a flight of steps to get to apartment. ADLs Comments: independent     Hand Dominance        Extremity/Trunk Assessment   Upper Extremity Assessment Upper Extremity Assessment: Overall WFL for tasks assessed    Lower Extremity Assessment Lower Extremity Assessment: Overall WFL for tasks  assessed    Cervical / Trunk Assessment Cervical / Trunk Assessment: Normal  Communication   Communication: No difficulties  Cognition Arousal/Alertness: Awake/alert Behavior During Therapy: WFL for tasks assessed/performed Overall Cognitive Status: Within Functional Limits for tasks assessed                                          General Comments      Exercises     Assessment/Plan    PT Assessment Patient needs continued PT services  PT Problem List Decreased mobility;Decreased  strength;Decreased activity tolerance;Cardiopulmonary status limiting activity       PT Treatment Interventions Gait training;Functional mobility training;Therapeutic activities;Stair training;Therapeutic exercise;Patient/family education    PT Goals (Current goals can be found in the Care Plan section)  Acute Rehab PT Goals Patient Stated Goal: to feel better, return home PT Goal Formulation: With patient Time For Goal Achievement: 07/24/21 Potential to Achieve Goals: Good    Frequency Min 2X/week     Co-evaluation               AM-PAC PT "6 Clicks" Mobility  Outcome Measure Help needed turning from your back to your side while in a flat bed without using bedrails?: None Help needed moving from lying on your back to sitting on the side of a flat bed without using bedrails?: None Help needed moving to and from a bed to a chair (including a wheelchair)?: None Help needed standing up from a chair using your arms (e.g., wheelchair or bedside chair)?: None Help needed to walk in hospital room?: A Little Help needed climbing 3-5 steps with a railing? : A Little 6 Click Score: 22    End of Session Equipment Utilized During Treatment: Oxygen Activity Tolerance: Treatment limited secondary to medical complications (Comment);Other (comment) (Increased HR and sob with mobility) Patient left: in bed;with call bell/phone within reach Nurse Communication: Mobility status PT Visit Diagnosis: Difficulty in walking, not elsewhere classified (R26.2)    Time: 1010-1033 PT Time Calculation (min) (ACUTE ONLY): 23 min   Charges:   PT Evaluation $PT Eval Moderate Complexity: 1 Mod PT Treatments $Gait Training: 8-22 mins        Lacresia Darwish, PT, GCS 07/10/21,10:46 AM

## 2021-07-10 NOTE — Progress Notes (Signed)
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 Emphysema diag 04/2021  recent OV telemed visit [facility]--Rx Medrol dosepak  Present [facility] Ed-coughing green--use Rescue, no relief--  Na 141 k 3.6 cl 113 bun/cr 14/0.6 Placed on bipap  Cxr no Pneumothor Echo canceled as it was felt that the patient had more a COPD/emphysema  She did not ultimately require BiPAP she continues to improve    Hospital-Problem based course  Acute hypoxic resopiratory failure 2/2 ?  Likely acute rhinoviral infection  discontinue doxycycline in a.m.-would continue for pulmonary hygiene she is still wheezing-ambulation HR to 130 and she was winded-her baseline that she is able to walk parking lot without any shortness of breath at all  Continue Solumedrol 40 IV q12 for now until she resolves a little bit more Cont albuterol q 4, Breo Ellipta qd, Spiriva 19 qd, singulair 10 CXR my over read 2/6 emphysema-no consolidation Echocardiogram ordered shows EF 55-60% without any heart failure ?  Lupus dsDNA is pending-outpatient follow-up Still smoker, emphysema She is down to 2 cigarettes-we counseled her I think she is ready to quit Lumbosacral radiculitis L3-L4-being referred to pain management per PCP last OV Avoid narcotics, avoid muscle relaxants-consider Cymbalta?  Consider tramadol Patient will need dedicated outpatient exercise/therapy  DVT prophylaxis: Heparin Code Status: Full Family Communication: No family present at this time Disposition:  Status is: Inpatient Remains inpatient appropriate because: Short of breath  Consultants:  none  Procedures: no  Antimicrobials: narrowed to Doxy   Subjective:  Awake coherent just finished walking Quite  winded and heart rate was quite rapid She has no chest pain however She is breathing heavier than she was yesterday with accessory muscle use  Objective: Vitals:   07/09/21 2009 07/09/21 2023 07/10/21 0434 07/10/21 0742  BP: (!) 187/88  (!) 151/98 (!) 155/98  Pulse: 84  81 95  Resp: 20  20 19   Temp: 97.8 F (36.6 C)  98.2 F (36.8 C) 98.3 F (36.8 C)  TempSrc: Oral  Oral   SpO2: 97% 97% 100% 95%  Weight:      Height:        Intake/Output Summary (Last 24 hours) at 07/10/2021 1006 Last data filed at 07/10/2021 [id number] Gross per 24 hour  Intake 1203 ml  Output 430 ml  Net 773 ml    Filed Weights   07/08/21 2211  Weight: 53.5 kg    Examination:  EOMI NCAT talking in full sentences but increased work of breathing No wheeze no rales no rhonchi S1-S2 no murmur no rub no gallop ROM intact no focal deficits Neurologically intact Abdomen soft nontender no rebound no guarding Psych-quite anxious  Data Reviewed: personally reviewed   CBC    Component Value Date/Time   WBC 6.9 07/09/2021 0544   RBC 4.01 07/09/2021 0544   HGB 12.2 07/09/2021 0544   HCT 37.6 07/09/2021 0544   PLT 167 07/09/2021 0544   MCV 93.8 07/09/2021 0544   MCH 30.4 07/09/2021 0544   MCHC 32.4 07/09/2021 0544   RDW 13.3 07/09/2021 0544   LYMPHSABS 0.7 07/08/2021 0016   MONOABS 0.4 07/08/2021 0016   EOSABS 0.0 07/08/2021 0016   BASOSABS 0.0 07/08/2021 0016   CMP Latest Ref Rng & Units 07/09/2021 07/08/2021 04/26/2021  Glucose 70 -  99 mg/dL 160(H) 125(H) 100(H)  BUN 6 - 20 mg/dL 13 14 11   Creatinine 0.44 - 1.00 mg/dL 0.57 0.57 0.63  Sodium 135 - 145 mmol/L 140 141 140  Potassium 3.5 - 5.1 mmol/L 4.4 3.6 3.6  Chloride 98 - 111 mmol/L 107 112(H) 107  CO2 22 - 32 mmol/L 26 24 27   Calcium 8.9 - 10.3 mg/dL 9.4 9.1 8.4(L)  Total Protein 6.5 - 8.1 g/dL - 6.9 -  Total Bilirubin 0.3 - 1.2 mg/dL - 0.6 -  Alkaline Phos 38 - 126 U/L - 63 -  AST 15 - 41 U/L - 14(L) -  ALT 0 - 44 U/L - 19 -     Radiology  Studies: DG Chest 2 View  Result Date: 07/10/2021 CLINICAL DATA:  Shortness of breath, hospital acquired pneumonia EXAM: CHEST - 2 VIEW COMPARISON:  07/08/2021 FINDINGS: Hyperinflation noted with upper lobe predominant emphysema. Bullous disease in the right upper lobe as before. Basilar vascular redistribution noted. No definite superimposed acute pneumonia, collapse or consolidation. Negative for edema, effusion or pneumothorax. Trachea midline. No acute osseous finding. IMPRESSION: Chronic emphysema pattern with hyperinflation. No superimposed acute process by plain radiography. Electronically Signed   By: Jerilynn Mages.  Shick M.D.   On: 07/10/2021 08:16   DG Chest Portable 1 View  Result Date: 07/08/2021 CLINICAL DATA:  Shortness of breath.  Question pulmonary infiltrate. EXAM: PORTABLE CHEST 1 VIEW COMPARISON:  04/24/2021 FINDINGS: Heart size is normal. Mediastinal shadows are normal. There is advanced emphysema. There may be hazy pneumonia in the lower lobes, but there is no dense consolidation, lobar collapse or visible effusion. Consider two-view chest radiography for more accurate evaluation. IMPRESSION: Underlying severe chronic emphysema. Cannot rule out hazy pneumonia in either lower lobe. Consider two-view chest radiography for better evaluation. Apparel should also be removed if the images are repeated. Electronically Signed   By: Nelson Chimes M.D.   On: 07/08/2021 22:59   ECHOCARDIOGRAM COMPLETE  Result Date: 07/09/2021    ECHOCARDIOGRAM REPORT   Patient Name:   Grace King Date of Exam: 07/09/2021 Medical Rec #:  [id number]    Height:       67.0 in Accession #:    [id number]   Weight:       118.0 lb Date of Birth:  03-01-1971     BSA:          1.616 m Patient Age:    51 years     BP:           167/115 mmHg Patient Gender: F            HR:           83 bpm. Exam Location:  [facility] Procedure: 2D Echo Indications:     SOB  History:         Patient has no prior history of Echocardiogram examinations.   Sonographer:     Kathlen Brunswick RDCS Referring Phys:  [place] Diagnosing Phys: Kate Sable MD  Sonographer Comments: Technically difficult study due to poor echo windows. Image acquisition challenging due to respiratory motion. IMPRESSIONS  1. Left ventricular ejection fraction, by estimation, is 55 to 60%. Left ventricular ejection fraction by PLAX is 56 %. The left ventricle has normal function. The left ventricle has no regional wall motion abnormalities. Left ventricular diastolic parameters were normal.  2. Right ventricular systolic function is normal. The right ventricular size is normal.  3. The mitral valve is normal in structure. No  evidence of mitral valve regurgitation.  4. The aortic valve is tricuspid. Aortic valve regurgitation is not visualized.  5. The inferior vena cava is dilated in size with >50% respiratory variability, suggesting right atrial pressure of 8 mmHg. FINDINGS  Left Ventricle: Left ventricular ejection fraction, by estimation, is 55 to 60%. Left ventricular ejection fraction by PLAX is 56 %. The left ventricle has normal function. The left ventricle has no regional wall motion abnormalities. The left ventricular internal cavity size was normal in size. There is no left ventricular hypertrophy. Left ventricular diastolic parameters were normal. Right Ventricle: The right ventricular size is normal. No increase in right ventricular wall thickness. Right ventricular systolic function is normal. Left Atrium: Left atrial size was normal in size. Right Atrium: Right atrial size was normal in size. Pericardium: There is no evidence of pericardial effusion. Mitral Valve: The mitral valve is normal in structure. No evidence of mitral valve regurgitation. Tricuspid Valve: The tricuspid valve is normal in structure. Tricuspid valve regurgitation is mild. Aortic Valve: The aortic valve is tricuspid. Aortic valve regurgitation is not visualized. Aortic valve peak gradient  measures 7.4 mmHg. Pulmonic Valve: The pulmonic valve was not well visualized. Pulmonic valve regurgitation is not visualized. Aorta: The aortic root is normal in size and structure. Venous: The inferior vena cava is dilated in size with greater than 50% respiratory variability, suggesting right atrial pressure of 8 mmHg. IAS/Shunts: No atrial level shunt detected by color flow Doppler.  LEFT VENTRICLE PLAX 2D LV EF:         Left            Diastology                ventricular     LV e' medial:    8.70 cm/s                ejection        LV E/e' medial:  9.6                fraction by     LV e' lateral:   9.68 cm/s                PLAX is 56      LV E/e' lateral: 8.6                %. LVIDd:         4.80 cm LVIDs:         3.40 cm LV PW:         1.10 cm LV IVS:        1.00 cm LVOT diam:     1.70 cm LV SV:         36 LV SV Index:   22 LVOT Area:     2.27 cm  RIGHT VENTRICLE RV Basal diam:  2.90 cm LEFT ATRIUM             Index LA diam:        3.00 cm 1.86 cm/m LA Vol (A2C):   32.3 ml 19.99 ml/m LA Vol (A4C):   20.1 ml 12.44 ml/m LA Biplane Vol: 26.8 ml 16.58 ml/m  AORTIC VALVE                 PULMONIC VALVE AV Area (Vmax): 1.45 cm     PV Vmax:       1.05 m/s AV Vmax:        136.00 cm/s  PV Peak grad:  4.4 mmHg AV Peak Grad:   7.4 mmHg LVOT Vmax:      86.80 cm/s LVOT Vmean:     61.000 cm/s LVOT VTI:       0.159 m  AORTA Ao Root diam: 2.50 cm MITRAL VALVE               TRICUSPID VALVE MV Area (PHT): 4.96 cm    TV Peak grad:   30.0 mmHg MV Decel Time: 153 msec    TV Vmax:        2.74 m/s MV E velocity: 83.60 cm/s MV A velocity: 89.10 cm/s  SHUNTS MV E/A ratio:  0.94        Systemic VTI:  0.16 m                            Systemic Diam: 1.70 cm Kate Sable MD Electronically signed by Kate Sable MD Signature Date/Time: 07/09/2021/12:01:01 PM    Final      Scheduled Meds:  dextromethorphan  15 mg Oral BID   doxycycline  100 mg Oral Q12H   fluticasone furoate-vilanterol  1 puff Inhalation Daily    heparin  5,000 Units Subcutaneous Q8H   ipratropium-albuterol  3 mL Nebulization BID   mouth rinse  15 mL Mouth Rinse BID   methylPREDNISolone (SOLU-MEDROL) injection  40 mg Intravenous Q12H   montelukast  10 mg Oral QHS   pantoprazole (PROTONIX) IV  40 mg Intravenous Q24H   sodium chloride flush  3 mL Intravenous Q12H   tiotropium  18 mcg Inhalation Daily   Continuous Infusions:     LOS: 1 day   Time spent: Mc[place], MD [facility] To contact the attending provider between 7A-7P or the covering provider during after hours 7P-7A, please log into the web site [email or web address] and access using universal [place] password for that web site. If you do not have the password, please call the hospital operator.  07/10/2021, 10:06 AM

## 2021-07-10 NOTE — Progress Notes (Signed)
Initial Nutrition Assessment  DOCUMENTATION CODES:   Underweight, Non-severe (moderate) malnutrition in context of chronic illness  INTERVENTION:   -MVI with minerals daily -Ensure Enlive po TID, each supplement provides 350 kcal and 20 grams of protein -Downgrade diet to dysphagia 3 (advanced mechanical soft) for ease of intake  NUTRITION DIAGNOSIS:   Moderate Malnutrition related to chronic illness (COPD) as evidenced by mild fat depletion, mild muscle depletion, moderate muscle depletion.  GOAL:   Patient will meet greater than or equal to 90% of their needs  MONITOR:   PO intake, Supplement acceptance, Labs, Weight trends, Skin, I & O's  REASON FOR ASSESSMENT:   Consult Assessment of nutrition requirement/status  ASSESSMENT:   Grace King is a 51 y.o. female with medical history significant of sob since past three weeks.  Pt admitted with SOB and erosive gastritis.   Reviewed I/O's: +1.3 L and +2 L since admission  UOP: 430 ml x 24 hours   Spoke with pt at bedside, who was pleasant and in good spirits today. She shares she usually has a very good appetite and consumes 3 meals per day (Breakfast: scrambled eggs and toast; Lunch and Dinner: meat, starch, and vegetable). Per pt, intake has been decreased over the past 3 days due to breathing. She has been keeping snacks such as juices, yogurts, and sodas at bedside.   Pt is missing multiple teeth and shares that it is difficult for her to consume foods off meal trays. Noted meal completions 60-100%.   Per pt, her UBW is around 115#. She endorses wt gain (per reports, admission wt 125#). Reviewed wt hx; wt has been stable over the past 4 months.    Discussed importance of good meal and supplement intake to promote healing. Pt amenable to diet fowngrade and Ensure.   Medications reviewed and include solu-medrol and spiriva.   Labs reviewed.   NUTRITION - FOCUSED PHYSICAL EXAM:  Flowsheet Row Most Recent Value   Orbital Region Mild depletion  Upper Arm Region No depletion  Thoracic and Lumbar Region No depletion  Buccal Region Moderate depletion  Temple Region Moderate depletion  Clavicle Bone Region Mild depletion  Clavicle and Acromion Bone Region Mild depletion  Scapular Bone Region Mild depletion  Dorsal Hand Mild depletion  Patellar Region Moderate depletion  Anterior Thigh Region Moderate depletion  Posterior Calf Region Moderate depletion  Edema (RD Assessment) None  Hair Reviewed  Eyes Reviewed  Mouth Reviewed  Skin Reviewed  Nails Reviewed       Diet Order:   Diet Order             DIET DYS 3 Room service appropriate? Yes; Fluid consistency: Thin  Diet effective now                   EDUCATION NEEDS:   Education needs have been addressed  Skin:  Skin Assessment: Reviewed RN Assessment  Last BM:  07/08/21  Height:   Ht Readings from Last 1 Encounters:  07/08/21 5\' 7"  (1.702 m)    Weight:   Wt Readings from Last 1 Encounters:  07/08/21 53.5 kg    Ideal Body Weight:  61.4 kg  BMI:  Body mass index is 18.48 kg/m.  Estimated Nutritional Needs:   Kcal:  1900-2100  Protein:  105-120 grams  Fluid:  > 1.9 L    09/05/21, RD, LDN, CDCES Registered Dietitian II Certified Diabetes Care and Education Specialist Please refer to [facility] for RD and/or RD on-call/weekend/after  hours pager

## 2021-07-10 NOTE — Evaluation (Signed)
Occupational Therapy Evaluation Patient Details Name: Grace King MRN: [id number] DOB: 23-Dec-1970 Today's Date: 07/10/2021   History of Present Illness Brailee King is a 51 y.o. female with medical history significant of sob since past three weeks. Patient diagnosed with emphysema in November. Reports she has Lupus.   Clinical Impression   [name] was seen for OT evaluation this date. Prior to hospital admission, pt was MOD I for mobility using RW PRN. Pt lives in 2nd story apartment with her daughter. Pt presents to acute OT demonstrating impaired ADL performance and functional mobility 2/2 decreased activity tolerance. Pt currently requires MOD I don/doff B socks seated EOB. SUPERVISION simulated standing grooming task, tolerates ~3 min standing c single UE support prior to requesting to sit - SpO2 94% on 2L [place].  Pt educated in energy conservation strategies including pursed lip breathing, activity pacing, work simplification, AE/DME, prioritizing of meaningful occupations, and falls prevention. Handout provided. HEP discussed for breathing exercises. Pt would benefit from skilled OT for further ECS education trials during OOB ADLs. Upon hospital discharge, recommend HHOT to maximize pt safety and return to PLOF.       Recommendations for follow up therapy are one component of a multi-disciplinary discharge planning process, led by the attending physician.  Recommendations may be updated based on patient status, additional functional criteria and insurance authorization.   Follow Up Recommendations  Home health OT    Assistance Recommended at Discharge Set up Supervision/Assistance  Patient can return home with the following A little help with walking and/or transfers;Help with stairs or ramp for entrance    Functional Status Assessment  Patient has had a recent decline in their functional status and demonstrates the ability to make significant improvements in function in a reasonable and  predictable amount of time.  Equipment Recommendations  None recommended by OT    Recommendations for Other Services       Precautions / Restrictions Precautions Precautions: Fall Precaution Comments: mod fall Restrictions Weight Bearing Restrictions: No      Mobility Bed Mobility Overal bed mobility: Independent                  Transfers Overall transfer level: Independent                        Balance Overall balance assessment: Mild deficits observed, not formally tested                                         ADL either performed or assessed with clinical judgement   ADL Overall ADL's : Needs assistance/impaired                                       General ADL Comments: MOD I don/doff B socks seated EOB. SUPERVISION simulated standing grooming task, tolerates ~3 min standing c single UE support prior to requesting to sit - SpO2 94% on 2L [place]      Pertinent Vitals/Pain Pain Assessment Pain Assessment: No/denies pain     Hand Dominance     Extremity/Trunk Assessment Upper Extremity Assessment Upper Extremity Assessment: Overall WFL for tasks assessed   Lower Extremity Assessment Lower Extremity Assessment: Overall WFL for tasks assessed   Cervical / Trunk Assessment Cervical / Trunk Assessment: Normal  Communication Communication Communication: No difficulties   Cognition Arousal/Alertness: Awake/alert Behavior During Therapy: WFL for tasks assessed/performed Overall Cognitive Status: Within Functional Limits for tasks assessed                                                  Home Living Family/patient expects to be discharged to:: Private residence Living Arrangements: Children;Spouse/significant other Available Help at Discharge: Family;Available PRN/intermittently Type of Home: Apartment Home Access: Stairs to enter Entrance Stairs-Number of Steps: flight   Home  Layout: One level         Bathroom Toilet: Handicapped height     Home Equipment: Agricultural consultant (2 wheels)          Prior Functioning/Environment Prior Level of Function : Independent/Modified Independent             Mobility Comments: patient had been working, not currently working. Does not go out much because she has a flight of steps to get to apartment. ADLs Comments: independent        OT Problem List: Decreased activity tolerance;Cardiopulmonary status limiting activity      OT Treatment/Interventions: Self-care/ADL training;Therapeutic exercise;Energy conservation;DME and/or AE instruction;Therapeutic activities;Patient/family education;Balance training    OT Goals(Current goals can be found in the care plan section) Acute Rehab OT Goals Patient Stated Goal: to go home OT Goal Formulation: With patient Time For Goal Achievement: 07/24/21 Potential to Achieve Goals: Good ADL Goals Pt Will Perform Grooming: Independently;standing (will tolerate >10 mins) Pt Will Transfer to Toilet: Independently;ambulating;regular height toilet Additional ADL Goal #1: Pt will verbalize plan to implement x3 ECS  OT Frequency: Min 1X/week    Co-evaluation              AM-PAC OT "6 Clicks" Daily Activity     Outcome Measure Help from another person eating meals?: None Help from another person taking care of personal grooming?: A Little Help from another person toileting, which includes using toliet, bedpan, or urinal?: A Little Help from another person bathing (including washing, rinsing, drying)?: A Little Help from another person to put on and taking off regular upper body clothing?: None Help from another person to put on and taking off regular lower body clothing?: None 6 Click Score: 21   End of Session Equipment Utilized During Treatment: Oxygen  Activity Tolerance: Patient tolerated treatment well Patient left: in bed;with call bell/phone within reach  OT  Visit Diagnosis: Other abnormalities of gait and mobility (R26.89)                Time: 8832-5498 OT Time Calculation (min): 18 min Charges:  OT General Charges $OT Visit: 1 Visit OT Evaluation $OT Eval Low Complexity: 1 Low OT Treatments $Self Care/Home Management : 8-22 mins  Kathie Dike, M.S. OTR/L  07/10/21, 1:28 PM  ascom [phone]

## 2021-07-10 NOTE — Progress Notes (Signed)
°   07/10/21 1300  Clinical Encounter Type  Visited With Patient  Visit Type Follow-up  Referral From Chaplain  Consult/Referral To [facility] followed up on request for AD. Patient was not quite ready and also requested a pen to fill in forms. Patient is due to leave on Wednesday and so will need this to be followed up before then.

## 2021-07-11 DIAGNOSIS — R0602 Shortness of breath: Secondary | ICD-10-CM | POA: Diagnosis not present

## 2021-07-11 DIAGNOSIS — E440 Moderate protein-calorie malnutrition: Secondary | ICD-10-CM | POA: Insufficient documentation

## 2021-07-11 LAB — ANTI-DNA ANTIBODY, DOUBLE-STRANDED: ds DNA Ab: 1 IU/mL (ref 0–9)

## 2021-07-11 MED ORDER — DOXYCYCLINE HYCLATE 100 MG PO TABS: 100.0000 mg | ORAL_TABLET | Freq: Two times a day (BID) | ORAL | 0 refills | Status: DC

## 2021-07-11 MED ORDER — GUAIFENESIN ER 600 MG PO TB12: 600.0000 mg | ORAL_TABLET | Freq: Two times a day (BID) | ORAL | 0 refills | Status: DC | PRN

## 2021-07-11 MED ORDER — PREDNISONE 10 MG (21) PO TBPK: ORAL_TABLET | ORAL | 0 refills | Status: AC

## 2021-07-11 MED ORDER — DEXTROMETHORPHAN POLISTIREX ER 30 MG/5ML PO SUER: 15.0000 mg | Freq: Two times a day (BID) | ORAL | 0 refills | Status: DC

## 2021-07-11 MED ORDER — AMLODIPINE BESYLATE 5 MG PO TABS: 5.0000 mg | ORAL_TABLET | Freq: Every day | ORAL | 0 refills | Status: DC

## 2021-07-11 NOTE — Progress Notes (Signed)
Oxycodone administered for back pain 9/10. Ondasetron PRN administered. See eMAR.

## 2021-07-11 NOTE — Progress Notes (Signed)
Patient provided with discharge education and materials, verbalized understanding. IV access and telemetry monitor removed, no issue noted. Patient notified family of discharge and pt stated that they will be here in about an hour with her home oxygen concentrator for transport.

## 2021-07-11 NOTE — Discharge Summary (Addendum)
Physician Discharge Summary   Patient: Grace King MRN: [id number] DOB: 11/16/1970  Admit date:     07/08/2021  Discharge date: 07/11/21  Discharge Physician: Rhetta MuraJai-Gurmukh Dnya Hickle   PCP: Lesly RubensteinVerka, Gabriella, MD  Recommendations at discharge:   Recommend outpatient pain management for lumbosacral dysfunction and recommend physical therapy to help with the same Ordering home health PT OT on discharge to assist with this process Steroid taper, doxycycline prescribed on discharge Resume home oxygen on discharge Needs Chem-12, CBC in 1 week,?  May need an x-ray in the outpatient setting Needs follow-up outpatient wise antidouble-stranded DNA as this would rule in/rule out lupus and this will need f/u c PCP Needs outpatient GI follow-up/referral  Discharge Diagnoses: Principal Problem:   SOB (shortness of breath) Active Problems:   Erosive gastritis   Multiple allergies   Sepsis (HCC)   Malnutrition of moderate degree  Resolved Problems:   * No resolved hospital problems. [facility]*   Hospital Course: 51 y asthma/COPD--on 2 LOxygen since 04/2021, underlying Lupus, migraines, TCP in the past, lumbosacral radiculitis in the past with L3-L4 stenosis [pending pain referral] ? Emphysema diag 04/2021   recent OV telemed visit [facility]--Rx Medrol dosepak   Present [facility] Ed-coughing green--use Rescue, no relief--   Na 141 k 3.6 cl 113 bun/cr 14/0.6 Placed on bipap   Cxr no Pneumothor Echo canceled as it was felt that the patient had more a COPD/emphysema   She did not ultimately require BiPAP and continued to improve during hospitalization  Acute hypoxic resopiratory failure 2/2 ?  Likely acute rhinoviral infection  discontinue doxycycline in a.m.-would continue for pulmonary hygiene Wheezes completely resolved-she is ambulating fairly well but does require her home oxygen at 2 L to be resumed on discharge Cont albuterol q 4, Breo Ellipta qd, Spiriva 19 qd, singulair 10 CXR my over read 2/6  emphysema-no consolidation Echocardiogram ordered shows EF 55-60% without any heart failure ?  Lupus dsDNA is pending-outpatient follow-up Still smoker, emphysema She is down to 2 cigarettes-we counseled her I think she is ready to quit--- she failed Chantix in the outpatient setting so may benefit from the 1 800 tobacco quit line, she may benefit from consideration for Cymbalta or for other medications Lumbosacral radiculitis L3-L4-being referred to pain management per PCP last OV Avoid narcotics, avoid muscle relaxants-consider Cymbalta as an outpatient  will need dedicated outpatient exercise/therapy       Consultants: n Procedures performed: n  Disposition: Home Diet recommendation:  Discharge Diet Orders (From admission, onward)     Start     Ordered   07/11/21 0000  Diet - low sodium heart healthy        07/11/21 1136           Regular diet  DISCHARGE MEDICATION: Allergies as of 07/11/2021       Reactions   Codeine Itching   Pills   Cucumber Extract Anaphylaxis   Pickles    Diclofenac Potassium Hives   Duloxetine Rash   Hydrocodone-acetaminophen Anaphylaxis   Oxycodone Itching   Pregabalin Hives, Rash   Shellfish Allergy Anaphylaxis   Erythromycin    Latex    Morphine Itching   Patient itched after administering 4 mg morphine   Other    pickles   Peanut-containing Drug Products    Penicillins    Ketorolac Rash   Tramadol Rash        Medication List     STOP taking these medications    tiZANidine 4 MG tablet Commonly  known as: ZANAFLEX       TAKE these medications    albuterol (2.5 MG/3ML) 0.083% nebulizer solution Commonly known as: PROVENTIL Inhale 3 mLs (2.5 mg total) into the lungs every 4 (four) hours as needed.   amLODipine 5 MG tablet Commonly known as: NORVASC Take 1 tablet (5 mg total) by mouth daily. Start taking on: July 12, 2021   budesonide-formoterol 160-4.5 MCG/ACT inhaler Commonly known as: SYMBICORT Inhale 2  puffs into the lungs 2 (two) times daily.   dextromethorphan 30 MG/5ML liquid Commonly known as: DELSYM Take 2.5 mLs (15 mg total) by mouth 2 (two) times daily.   doxycycline 100 MG tablet Commonly known as: VIBRA-TABS Take 1 tablet (100 mg total) by mouth every 12 (twelve) hours.   EPINEPHrine 0.3 mg/0.3 mL Soaj injection Commonly known as: EPI-PEN Inject 0.3 mLs (0.3 mg total) into the muscle as needed for anaphylaxis.   guaiFENesin 600 MG 12 hr tablet Commonly known as: MUCINEX Take 1 tablet (600 mg total) by mouth 2 (two) times daily as needed for cough or to loosen phlegm.   montelukast 10 MG tablet Commonly known as: SINGULAIR Take 10 mg by mouth at bedtime.   oxyCODONE-acetaminophen 5-325 MG tablet Commonly known as: PERCOCET/ROXICET Take 1 tablet by mouth every 6 (six) hours as needed for severe pain.   pantoprazole 40 MG tablet Commonly known as: PROTONIX Take 40 mg by mouth 2 (two) times daily.   predniSONE 10 MG (21) Tbpk tablet Commonly known as: STERAPRED UNI-PAK 21 TAB Take 4 tablets (40 mg total) by mouth daily for 3 days, THEN 2 tablets (20 mg total) daily for 3 days, THEN 1 tablet (10 mg total) daily for 3 days. Start taking on: July 11, 2021   Spiriva HandiHaler 18 MCG inhalation capsule Generic drug: tiotropium Place 18 mcg into inhaler and inhale daily.   SUMAtriptan 50 MG tablet Commonly known as: IMITREX Take 50 mg by mouth daily as needed for migraine. (May repeat after 2 hours if needed)               Durable Medical Equipment  (From admission, onward)           Start     Ordered   07/10/21 1451  For home use only DME Tub bench  Once        07/10/21 1450             Discharge Exam: Filed Weights   07/08/21 2211 07/10/21 1949  Weight: 53.5 kg 53.5 kg   Awake coherent no distress EOMI NCAT no focal deficit Poor dentition CTA B no added sound no rales no rhonchi S1-S2 no murmur no rub no gallop ROM intact no focal  deficit Neurologically intact moving all 4 limbs equally without any issue   Condition at discharge: fair  The results of significant diagnostics from this hospitalization (including imaging, microbiology, ancillary and laboratory) are listed below for reference.   Imaging Studies: DG Chest 2 View  Result Date: 07/10/2021 CLINICAL DATA:  Shortness of breath, hospital acquired pneumonia EXAM: CHEST - 2 VIEW COMPARISON:  07/08/2021 FINDINGS: Hyperinflation noted with upper lobe predominant emphysema. Bullous disease in the right upper lobe as before. Basilar vascular redistribution noted. No definite superimposed acute pneumonia, collapse or consolidation. Negative for edema, effusion or pneumothorax. Trachea midline. No acute osseous finding. IMPRESSION: Chronic emphysema pattern with hyperinflation. No superimposed acute process by plain radiography. Electronically Signed   By: Judie Petit.  Shick M.D.   On:  07/10/2021 08:16   DG Chest Portable 1 View  Result Date: 07/08/2021 CLINICAL DATA:  Shortness of breath.  Question pulmonary infiltrate. EXAM: PORTABLE CHEST 1 VIEW COMPARISON:  04/24/2021 FINDINGS: Heart size is normal. Mediastinal shadows are normal. There is advanced emphysema. There may be hazy pneumonia in the lower lobes, but there is no dense consolidation, lobar collapse or visible effusion. Consider two-view chest radiography for more accurate evaluation. IMPRESSION: Underlying severe chronic emphysema. Cannot rule out hazy pneumonia in either lower lobe. Consider two-view chest radiography for better evaluation. Apparel should also be removed if the images are repeated. Electronically Signed   By: Paulina Fusi M.D.   On: 07/08/2021 22:59   ECHOCARDIOGRAM COMPLETE  Result Date: 07/09/2021    ECHOCARDIOGRAM REPORT   Patient Name:   SORAH FALKENSTEIN Date of Exam: 07/09/2021 Medical Rec #:  [id number]    Height:       67.0 in Accession #:    [id number]   Weight:       118.0 lb Date of Birth:  07-Jun-1970      BSA:          1.616 m Patient Age:    50 years     BP:           167/115 mmHg Patient Gender: F            HR:           83 bpm. Exam Location:  [facility] Procedure: 2D Echo Indications:     SOB  History:         Patient has no prior history of Echocardiogram examinations.  Sonographer:     Overton Mam RDCS Referring Phys:  [id number] Eliezer Mccoy PATEL Diagnosing Phys: Debbe Odea MD  Sonographer Comments: Technically difficult study due to poor echo windows. Image acquisition challenging due to respiratory motion. IMPRESSIONS  1. Left ventricular ejection fraction, by estimation, is 55 to 60%. Left ventricular ejection fraction by PLAX is 56 %. The left ventricle has normal function. The left ventricle has no regional wall motion abnormalities. Left ventricular diastolic parameters were normal.  2. Right ventricular systolic function is normal. The right ventricular size is normal.  3. The mitral valve is normal in structure. No evidence of mitral valve regurgitation.  4. The aortic valve is tricuspid. Aortic valve regurgitation is not visualized.  5. The inferior vena cava is dilated in size with >50% respiratory variability, suggesting right atrial pressure of 8 mmHg. FINDINGS  Left Ventricle: Left ventricular ejection fraction, by estimation, is 55 to 60%. Left ventricular ejection fraction by PLAX is 56 %. The left ventricle has normal function. The left ventricle has no regional wall motion abnormalities. The left ventricular internal cavity size was normal in size. There is no left ventricular hypertrophy. Left ventricular diastolic parameters were normal. Right Ventricle: The right ventricular size is normal. No increase in right ventricular wall thickness. Right ventricular systolic function is normal. Left Atrium: Left atrial size was normal in size. Right Atrium: Right atrial size was normal in size. Pericardium: There is no evidence of pericardial effusion. Mitral Valve: The mitral valve is normal in  structure. No evidence of mitral valve regurgitation. Tricuspid Valve: The tricuspid valve is normal in structure. Tricuspid valve regurgitation is mild. Aortic Valve: The aortic valve is tricuspid. Aortic valve regurgitation is not visualized. Aortic valve peak gradient measures 7.4 mmHg. Pulmonic Valve: The pulmonic valve was not well visualized. Pulmonic valve regurgitation is not visualized. Aorta: The aortic  root is normal in size and structure. Venous: The inferior vena cava is dilated in size with greater than 50% respiratory variability, suggesting right atrial pressure of 8 mmHg. IAS/Shunts: No atrial level shunt detected by color flow Doppler.  LEFT VENTRICLE PLAX 2D LV EF:         Left            Diastology                ventricular     LV e' medial:    8.70 cm/s                ejection        LV E/e' medial:  9.6                fraction by     LV e' lateral:   9.68 cm/s                PLAX is 56      LV E/e' lateral: 8.6                %. LVIDd:         4.80 cm LVIDs:         3.40 cm LV PW:         1.10 cm LV IVS:        1.00 cm LVOT diam:     1.70 cm LV SV:         36 LV SV Index:   22 LVOT Area:     2.27 cm  RIGHT VENTRICLE RV Basal diam:  2.90 cm LEFT ATRIUM             Index LA diam:        3.00 cm 1.86 cm/m LA Vol (A2C):   32.3 ml 19.99 ml/m LA Vol (A4C):   20.1 ml 12.44 ml/m LA Biplane Vol: 26.8 ml 16.58 ml/m  AORTIC VALVE                 PULMONIC VALVE AV Area (Vmax): 1.45 cm     PV Vmax:       1.05 m/s AV Vmax:        136.00 cm/s  PV Peak grad:  4.4 mmHg AV Peak Grad:   7.4 mmHg LVOT Vmax:      86.80 cm/s LVOT Vmean:     61.000 cm/s LVOT VTI:       0.159 m  AORTA Ao Root diam: 2.50 cm MITRAL VALVE               TRICUSPID VALVE MV Area (PHT): 4.96 cm    TV Peak grad:   30.0 mmHg MV Decel Time: 153 msec    TV Vmax:        2.74 m/s MV E velocity: 83.60 cm/s MV A velocity: 89.10 cm/s  SHUNTS MV E/A ratio:  0.94        Systemic VTI:  0.16 m                            Systemic Diam: 1.70 cm  Debbe Odea MD Electronically signed by Debbe Odea MD Signature Date/Time: 07/09/2021/12:01:01 PM    Final     Microbiology: Results for orders placed or performed during the hospital encounter of 07/08/21  Blood culture (routine x 2)     Status: None (Preliminary result)   Collection Time: 07/08/21 12:16  AM   Specimen: BLOOD  Result Value Ref Range Status   Specimen Description BLOOD RIGHT ANTECUBITAL  Final   Special Requests   Final    BOTTLES DRAWN AEROBIC AND ANAEROBIC Blood Culture adequate volume   Culture   Final    NO GROWTH 1 DAY Performed at [facility], [address] [place], [place] [address]    Report Status PENDING  Incomplete  Resp Panel by RT-PCR (Flu A&B, Covid) Nasopharyngeal Swab     Status: None   Collection Time: 07/08/21 10:20 PM   Specimen: Nasopharyngeal Swab; Nasopharyngeal(NP) swabs in vial transport medium  Result Value Ref Range Status   SARS Coronavirus 2 by RT PCR NEGATIVE NEGATIVE Final    Comment: (NOTE) SARS-CoV-2 target nucleic acids are NOT DETECTED.  The SARS-CoV-2 RNA is generally detectable in upper respiratory specimens during the acute phase of infection. The lowest concentration of SARS-CoV-2 viral copies this assay can detect is 138 copies/mL. A negative result does not preclude SARS-Cov-2 infection and should not be used as the sole basis for treatment or other patient management decisions. A negative result may occur with  improper specimen collection/handling, submission of specimen other than nasopharyngeal swab, presence of viral mutation(s) within the areas targeted by this assay, and inadequate number of viral copies(<138 copies/mL). A negative result must be combined with clinical observations, patient history, and epidemiological information. The expected result is Negative.  Fact Sheet for Patients:  BloggerCourse.comhttps://www.fda.gov/media/152166/download  Fact Sheet for Healthcare Providers:   SeriousBroker.ithttps://www.fda.gov/media/152162/download  This test is no t yet approved or cleared by the [place] FDA and  has been authorized for detection and/or diagnosis of SARS-CoV-2 by FDA under an Emergency Use Authorization (EUA). This EUA will remain  in effect (meaning this test can be used) for the duration of the COVID-19 declaration under Section 564(b)(1) of the Act, 21 U.S.C.section 360bbb-3(b)(1), unless the authorization is terminated  or revoked sooner.       Influenza A by PCR NEGATIVE NEGATIVE Final   Influenza B by PCR NEGATIVE NEGATIVE Final    Comment: (NOTE) The Xpert Xpress SARS-CoV-2/FLU/RSV plus assay is intended as an aid in the diagnosis of influenza from Nasopharyngeal swab specimens and should not be used as a sole basis for treatment. Nasal washings and aspirates are unacceptable for Xpert Xpress SARS-CoV-2/FLU/RSV testing.  Fact Sheet for Patients: BloggerCourse.comhttps://www.fda.gov/media/152166/download  Fact Sheet for Healthcare Providers: SeriousBroker.ithttps://www.fda.gov/media/152162/download  This test is not yet approved or cleared by the [place] FDA and has been authorized for detection and/or diagnosis of SARS-CoV-2 by FDA under an Emergency Use Authorization (EUA). This EUA will remain in effect (meaning this test can be used) for the duration of the COVID-19 declaration under Section 564(b)(1) of the Act, 21 U.S.C. section 360bbb-3(b)(1), unless the authorization is terminated or revoked.  Performed at [facility], [address] [place], [place] [address]   Blood culture (routine x 2)     Status: None (Preliminary result)   Collection Time: 07/08/21 10:40 PM   Specimen: BLOOD  Result Value Ref Range Status   Specimen Description BLOOD BLOOD RIGHT FOREARM  Final   Special Requests   Final    BOTTLES DRAWN AEROBIC AND ANAEROBIC Blood Culture adequate volume   Culture   Final    NO GROWTH 1 DAY Performed at [facility], [address] [place], [place] [address]    Report Status PENDING  Incomplete  Respiratory (~20 pathogens) panel by PCR  Status: Abnormal   Collection Time: 07/09/21  9:46 AM   Specimen: Nasopharyngeal Swab; Respiratory  Result Value Ref Range Status   Adenovirus NOT DETECTED NOT DETECTED Final   Coronavirus 229E NOT DETECTED NOT DETECTED Final    Comment: (NOTE) The Coronavirus on the Respiratory Panel, DOES NOT test for the novel  Coronavirus (2019 nCoV)    Coronavirus HKU1 NOT DETECTED NOT DETECTED Final   Coronavirus NL63 NOT DETECTED NOT DETECTED Final   Coronavirus OC43 NOT DETECTED NOT DETECTED Final   Metapneumovirus NOT DETECTED NOT DETECTED Final   Rhinovirus / Enterovirus DETECTED (A) NOT DETECTED Final   Influenza A NOT DETECTED NOT DETECTED Final   Influenza B NOT DETECTED NOT DETECTED Final   Parainfluenza Virus 1 NOT DETECTED NOT DETECTED Final   Parainfluenza Virus 2 NOT DETECTED NOT DETECTED Final   Parainfluenza Virus 3 NOT DETECTED NOT DETECTED Final   Parainfluenza Virus 4 NOT DETECTED NOT DETECTED Final   Respiratory Syncytial Virus NOT DETECTED NOT DETECTED Final   Bordetella pertussis NOT DETECTED NOT DETECTED Final   Bordetella Parapertussis NOT DETECTED NOT DETECTED Final   Chlamydophila pneumoniae NOT DETECTED NOT DETECTED Final   Mycoplasma pneumoniae NOT DETECTED NOT DETECTED Final    Comment: Performed at [facility], [address], [place], [place] [address]    Labs: CBC: Recent Labs  Lab 07/08/21 0016 07/09/21 0544  WBC 9.3 6.9  NEUTROABS 8.1*  --   HGB 12.5 12.2  HCT 38.6 37.6  MCV 95.5 93.8  PLT 186 167   Basic Metabolic Panel: Recent Labs  Lab 07/08/21 2215 07/09/21 0544  NA 141 140  K 3.6 4.4  CL 112* 107  CO2 24 26  GLUCOSE 125* 160*  BUN 14 13  CREATININE 0.57 0.57  CALCIUM 9.1 9.4   Liver Function Tests: Recent Labs  Lab 07/08/21 2215  AST 14*  ALT 19  ALKPHOS 63  BILITOT 0.6  PROT 6.9  ALBUMIN 3.5   CBG: No  results for input(s): GLUCAP in the last 168 hours.  Discharge time spent: greater than 30 minutes.  Signed: Rhetta Mura, MD [facility] 07/11/2021

## 2021-07-11 NOTE — Progress Notes (Signed)
Patient complained of coughing and shortness of breath. Patient is currently on 2L [place]. Mucinex administered and Dextromethorphan previously administered at 2200.   O2 Sat is 100% and patient's breathing  at the moment is regular and unlabored.

## 2021-07-11 NOTE — TOC Transition Note (Signed)
Transition of Care [facility]) - CM/SW Discharge Note   Patient Details  Name: Grace King MRN: [id number] Date of Birth: [birth year]  Transition of Care [facility] CM/SW Contact:  Gildardo Griffes, LCSW Phone Number: 07/11/2021, 12:01 PM   Clinical Narrative:     Patient to discharge home today with home health PT through Delila Spence with [facility] informed of dc today.   Patient has a walker at home, requested tub bench. [employer] has ordered tub bench and per patient's request will be shipped to her home.     Patient reports having home O2 through [employer], Ian Malkin with [employer] reports they went to patient 's home yesterday and followed up on filling her tanks.    Daughter to transport home at time of discharge.   No further discharge needs identified at this time.   Final next level of care: Home w Home Health Services Barriers to Discharge: No Barriers Identified   Patient Goals and CMS Choice Patient states their goals for this hospitalization and ongoing recovery are:: to go home CMS Medicare.gov Compare Post Acute Care list provided to:: Patient Choice offered to / list presented to : Patient  Discharge Placement                  Name of family member notified: daughter Patient and family notified of of transfer: 07/11/21  Discharge Plan and Services                DME Arranged: Tub bench, Oxygen DME Agency: [employer] Date DME Agency Contacted: 07/11/21 Time DME Agency Contacted: 1200 Representative spoke with at DME Agency: Ian Malkin HH Arranged: PT HH Agency: [facility] Date [facility] Agency Contacted: 07/11/21 Time HH Agency Contacted: 1200 Representative spoke with at [facility] Agency: Kandee Keen  Social Determinants of Health (SDOH) Interventions     Readmission Risk Interventions No flowsheet data found.

## 2021-07-11 NOTE — Progress Notes (Signed)
°  Chaplain On-Call spoke by phone with Raynelle Fanning at the [employer].  Still there are no Volunteers available to serve as Witnesses for the patient's Advance Directives documents.  Raynelle Fanning stated that she expects Volunteers to be in this afternoon.  Chaplains will continue to seek Volunteers and a hospital Notary for completion of the documents.  Chaplain Evelena Peat M.Div., [facility]

## 2021-07-11 NOTE — Progress Notes (Signed)
°  Chaplain On-Call visited with the patient at 1025 hours this morning.  Patient showed this Chaplain the Advance Directives documents that she has filled in with her health care choices and the naming of her Health Care Agents.  Chaplain told patient that two Witnesses and a Notary must be located to assist with completing the process.  At 1030, Chaplain learned from [employer] Desk that no Volunteers are available at this time to serve as Witnesses.  Chaplains will continue to seek Volunteers and a hospital employee Notary during the day.  Chaplain Evelena Peat M.Div., [facility]

## 2021-07-14 LAB — CULTURE, BLOOD (ROUTINE X 2)
Culture: NO GROWTH
Culture: NO GROWTH
Special Requests: ADEQUATE
Special Requests: ADEQUATE

## 2021-08-18 ENCOUNTER — Other Ambulatory Visit: Payer: Self-pay

## 2021-08-18 ENCOUNTER — Encounter: Payer: Self-pay | Admitting: Emergency Medicine

## 2021-08-18 ENCOUNTER — Ambulatory Visit
Admission: EM | Admit: 2021-08-18 | Discharge: 2021-08-18 | Disposition: A | Discharge status: Home / Self Care | Payer: Medicare HMO | Attending: Physician Assistant | Admitting: Physician Assistant

## 2021-08-18 DIAGNOSIS — R197 Diarrhea, unspecified: Secondary | ICD-10-CM

## 2021-08-18 DIAGNOSIS — R1033 Periumbilical pain: Secondary | ICD-10-CM

## 2021-08-18 DIAGNOSIS — Z79899 Other long term (current) drug therapy: Secondary | ICD-10-CM

## 2021-08-18 DIAGNOSIS — R112 Nausea with vomiting, unspecified: Secondary | ICD-10-CM

## 2021-08-18 DIAGNOSIS — Z8711 Personal history of peptic ulcer disease: Secondary | ICD-10-CM

## 2021-08-18 MED ORDER — ONDANSETRON 8 MG PO TBDP
8.0000 mg | ORAL_TABLET | Freq: Once | ORAL | Status: AC
  Administered 2021-08-18: 8 mg via ORAL

## 2021-08-18 NOTE — Discharge Instructions (Signed)

## 2021-08-18 NOTE — ED Provider Notes (Signed)
?[facility] ? ? ? ?CSN: [id number] ?Arrival date & time: 08/18/21  1420 ? ? ?  ? ?History   ?Chief Complaint ?Chief Complaint  ?Patient presents with  ? Abdominal Pain  ? Emesis  ? ? ?HPI ?Grace King is a 51 y.o. female presenting for periumbilical and epigastric abdominal cramping pain over the past 4 days.  Patient reports it is associated with diarrhea at onset but that has resolved.  She also reports nausea with numerous episodes of vomiting each day.  Body aches, headaches and fatigue as well.  Denies fever.  No change in baseline cough.  Breathing normally.  No reports of dark stools or blood in stool.  She says the abdominal pain is constant but is relieved a little after she has vomiting episode.  She does report a history of peptic ulcers and says she is felt similar symptoms in the past when she has had ulcers but this seems worse.  Patient denies any sick contacts.  Does not report any any potentially uncooked or spoiled foods.  No recent travel.  No new medications in the past couple of weeks.  No recent antibiotics.  Patient does take oxycodone 5-3 25 4  times daily and has taken that for many years.  Reports she ran out of the medication 4 days ago.  Patient's other past medical history significant for asthma, COPD, lupus. ? ?HPI ? ?Past Medical History:  ?Diagnosis Date  ? Asthma   ? COPD (chronic obstructive pulmonary disease) (HCC)   ? Lupus (HCC)   ? ? ?Patient Active Problem List  ? Diagnosis Date Noted  ? Malnutrition of moderate degree 07/11/2021  ? Sepsis (HCC) 07/09/2021  ? SOB (shortness of breath) 07/08/2021  ? Multiple allergies 07/08/2021  ? COPD exacerbation (HCC) 02/28/2020  ? Erosive gastritis 03/01/2015  ? ? ?Past Surgical History:  ?Procedure Laterality Date  ? ABDOMINAL HYSTERECTOMY    ? BACK SURGERY    ? ? ?OB History   ?No obstetric history on file. ?  ? ? ? ?Home Medications   ? ?Prior to Admission medications   ?Medication Sig Start Date End Date Taking? Authorizing  Provider  ?amLODipine (NORVASC) 5 MG tablet Take 1 tablet (5 mg total) by mouth daily. 07/12/21  Yes Rhetta MuraSamtani, Jai-Gurmukh, MD  ?budesonide-formoterol (SYMBICORT) 160-4.5 MCG/ACT inhaler Inhale 2 puffs into the lungs 2 (two) times daily.   Yes [provider]  ?montelukast (SINGULAIR) 10 MG tablet Take 10 mg by mouth at bedtime.    Yes [provider]  ?pantoprazole (PROTONIX) 40 MG tablet Take 40 mg by mouth 2 (two) times daily.   Yes [provider]  ?SUMAtriptan (IMITREX) 50 MG tablet Take 50 mg by mouth daily as needed for migraine. (May repeat after 2 hours if needed)   Yes [provider]  ?tiotropium (SPIRIVA HANDIHALER) 18 MCG inhalation capsule Place 18 mcg into inhaler and inhale daily.   Yes [provider]  ?albuterol (PROVENTIL) (2.5 MG/3ML) 0.083% nebulizer solution Inhale 3 mLs (2.5 mg total) into the lungs every 4 (four) hours as needed. 02/29/20 04/26/21  Marguerita MerlesSheikh, Omair Latif, DO  ?dextromethorphan (DELSYM) 30 MG/5ML liquid Take 2.5 mLs (15 mg total) by mouth 2 (two) times daily. 07/11/21   Rhetta MuraSamtani, Jai-Gurmukh, MD  ?doxycycline (VIBRA-TABS) 100 MG tablet Take 1 tablet (100 mg total) by mouth every 12 (twelve) hours. 07/11/21   Rhetta MuraSamtani, Jai-Gurmukh, MD  ?EPINEPHrine 0.3 mg/0.3 mL IJ SOAJ injection Inject 0.3 mLs (0.3 mg total) into  the muscle as needed for anaphylaxis. 11/14/19   Nita Sickle, MD  ?guaiFENesin (MUCINEX) 600 MG 12 hr tablet Take 1 tablet (600 mg total) by mouth 2 (two) times daily as needed for cough or to loosen phlegm. 07/11/21   Rhetta Mura, MD  ?oxyCODONE-acetaminophen (PERCOCET/ROXICET) 5-325 MG tablet Take 1 tablet by mouth every 6 (six) hours as needed for severe pain.    [provider]  ? ? ?Family History ?No family history on file. ? ?Social History ?Social History  ? ?Tobacco Use  ? Smoking status: Some Days  ?  Types: Cigarettes  ?  Last attempt to quit: 04/09/2017  ?  Years since quitting: 4.3  ? Smokeless tobacco:  Never  ?Vaping Use  ? Vaping Use: Never used  ?Substance Use Topics  ? Alcohol use: Never  ? Drug use: Never  ? ? ? ?Allergies   ?Codeine, Cucumber extract, Diclofenac potassium, Duloxetine, Hydrocodone-acetaminophen, Oxycodone, Pregabalin, Shellfish allergy, Erythromycin, Latex, Morphine, Other, Peanut-containing drug products, Penicillins, Ketorolac, and Tramadol ? ? ?Review of Systems ?Review of Systems  ?Constitutional:  Positive for appetite change, chills and fatigue. Negative for fever.  ?HENT:  Negative for congestion.   ?Respiratory:  Positive for cough (no change from baseline). Negative for shortness of breath.   ?Cardiovascular:  Negative for chest pain.  ?Gastrointestinal:  Positive for abdominal pain, diarrhea, nausea and vomiting.  ?Genitourinary:  Negative for dysuria, flank pain and frequency.  ?Musculoskeletal:  Positive for myalgias.  ?Neurological:  Positive for headaches. Negative for dizziness, weakness and light-headedness.  ? ? ?Physical Exam ?Triage Vital Signs ?ED Triage Vitals  ?Enc Vitals Group  ?   BP 08/18/21 1434 (!) 143/93  ?   Pulse Rate 08/18/21 1434 98  ?   Resp 08/18/21 1434 16  ?   Temp 08/18/21 1434 98.8 ?F (37.1 ?C)  ?   Temp Source 08/18/21 1434 Oral  ?   SpO2 08/18/21 1434 97 %  ?   Weight --   ?   Height --   ?   Head Circumference --   ?   Peak Flow --   ?   Pain Score 08/18/21 1432 9  ?   Pain Loc --   ?   Pain Edu? --   ?   Excl. in GC? --   ? ?No data found. ? ?Updated Vital Signs ?BP (!) 143/93   Pulse 98   Temp 98.8 ?F (37.1 ?C) (Oral)   Resp 16   SpO2 97%  ?    ? ?Physical Exam ?Vitals and nursing note reviewed.  ?Constitutional:   ?   General: She is not in acute distress. ?   Appearance: Normal appearance. She is ill-appearing (appears very uncomfortable). She is not toxic-appearing.  ?HENT:  ?   Head: Normocephalic and atraumatic.  ?   Nose: Nose normal.  ?   Mouth/Throat:  ?   Mouth: Mucous membranes are moist.  ?   Pharynx: Oropharynx is clear.  ?Eyes:  ?    General: No scleral icterus.    ?   Right eye: No discharge.     ?   Left eye: No discharge.  ?   Conjunctiva/sclera: Conjunctivae normal.  ?Cardiovascular:  ?   Rate and Rhythm: Normal rate and regular rhythm.  ?   Heart sounds: Normal heart sounds.  ?Pulmonary:  ?   Effort: Pulmonary effort is normal. No respiratory distress.  ?   Breath sounds: Normal breath sounds.  ?Abdominal:  ?  General: Bowel sounds are normal.  ?   Palpations: Abdomen is soft.  ?   Tenderness: There is abdominal tenderness in the epigastric area and periumbilical area. There is guarding (periumbilical). There is no right CVA tenderness, left CVA tenderness or rebound.  ?Musculoskeletal:  ?   Cervical back: Neck supple.  ?Skin: ?   General: Skin is dry.  ?Neurological:  ?   General: No focal deficit present.  ?   Mental Status: She is alert. Mental status is at baseline.  ?   Motor: No weakness.  ?   Gait: Gait normal.  ?Psychiatric:     ?   Mood and Affect: Mood normal.     ?   Behavior: Behavior normal.     ?   Thought Content: Thought content normal.  ? ? ? ?UC Treatments / Results  ?Labs ?(all labs ordered are listed, but only abnormal results are displayed) ?Labs Reviewed - No data to display ? ?EKG ? ? ?Radiology ?No results found. ? ?Procedures ?Procedures (including critical care time) ? ?Medications Ordered in UC ?Medications  ?ondansetron (ZOFRAN-ODT) disintegrating tablet 8 mg (8 mg Oral Given 08/18/21 1450)  ? ? ?Initial Impression / Assessment and Plan / UC Course  ?I have reviewed the triage vital signs and the nursing notes. ? ?Pertinent labs & imaging results that were available during my care of the patient were reviewed by me and considered in my medical decision making (see chart for details). ? ?51 year old female presenting for cramping abdominal pain with associated nausea and vomiting.  Also headaches, body aches and chills.  No fever.  No sick contacts, recent travel or recent antibiotics.  History of PUD and opioid  dependence.  Patient has been out of her narcotic medication for several days. ? ?Vital stable.  She is ill-appearing but nontoxic.  On exam she has tenderness palpation of the epigastric and periumbilical

## 2021-08-18 NOTE — ED Triage Notes (Signed)
PT reports abdominal pain, emesis, and diarrhea for a few days. Pain relieves after vomiting, but increases over time until next episode.  ?

## 2022-03-27 DIAGNOSIS — R531 Weakness: Secondary | ICD-10-CM | POA: Diagnosis not present

## 2022-03-27 DIAGNOSIS — J441 Chronic obstructive pulmonary disease with (acute) exacerbation: Secondary | ICD-10-CM | POA: Diagnosis not present

## 2022-06-05 IMAGING — CT CT ANGIO CHEST
2 of 7 series · 19 of 46 positions shown · IV contrast (APPLIED)
Comparison: Chest x-ray 04/24/2021

CLINICAL DATA: Shortness of breath and cough

EXAM:
CT ANGIOGRAPHY CHEST WITH CONTRAST
TECHNIQUE: Multidetector CT imaging of the chest was performed using the
standard protocol during bolus administration of intravenous
contrast. Multiplanar CT image reconstructions and MIPs were
obtained to evaluate the vascular anatomy.
CONTRAST:  75mL OMNIPAQUE IOHEXOL 350 MG/ML SOLN

[Series 6: thins · axial · 0.70mm/px · z∈[-331,-42]mm · 16 of 403 slices shown]
[im 21/403  lung]
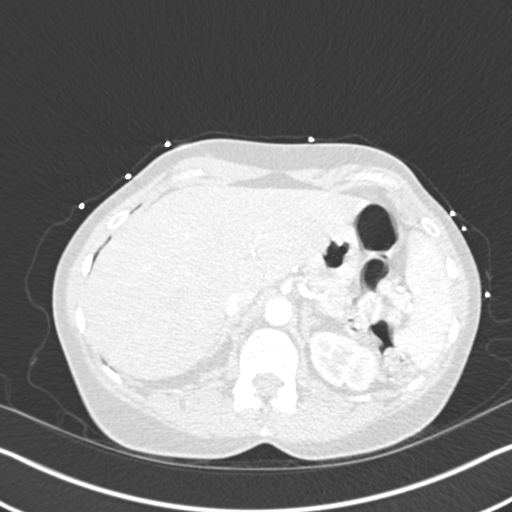
[im 41/403  soft-tissue]
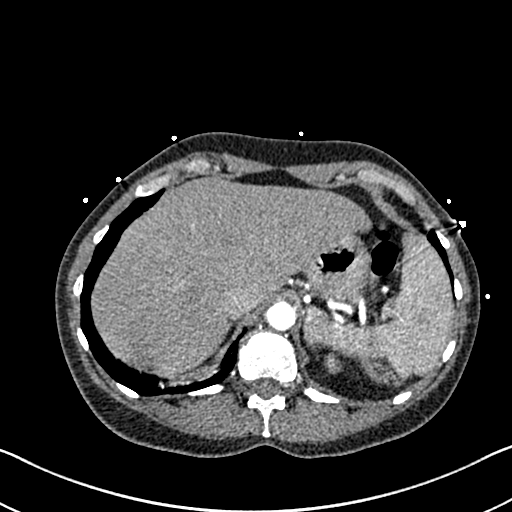
[im 61/403  lung]
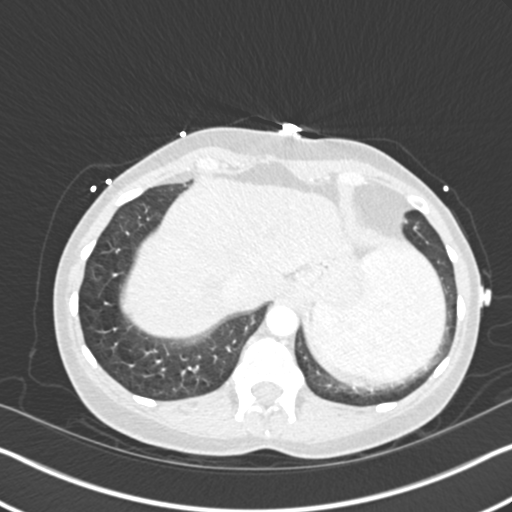
[im 101/403  soft-tissue]
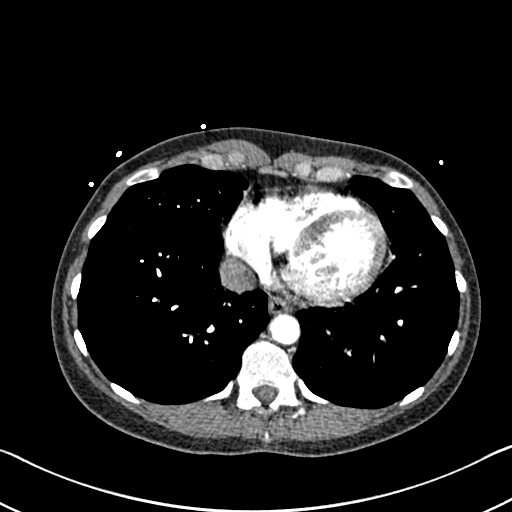
[im 121/403  lung]
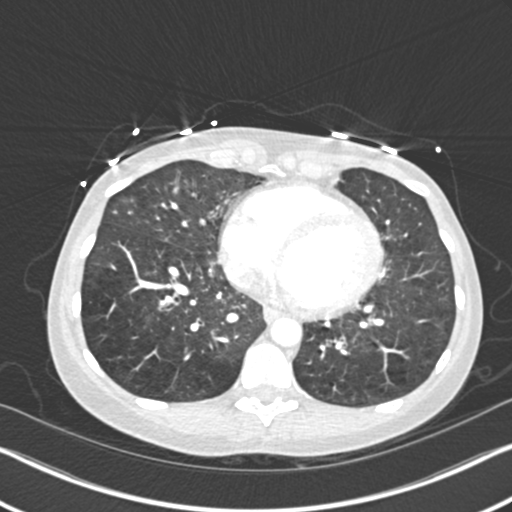
[im 141/403  soft-tissue]
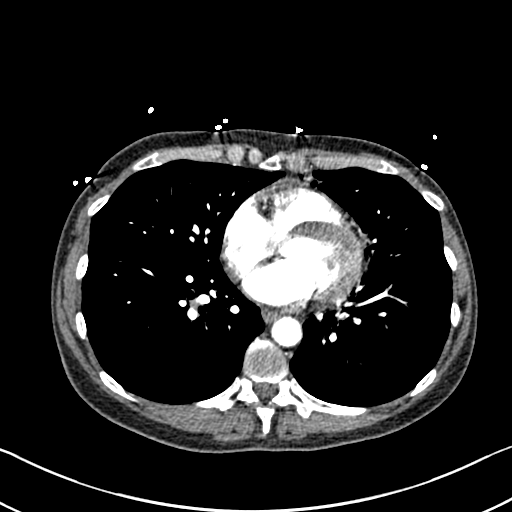
[im 161/403  lung]
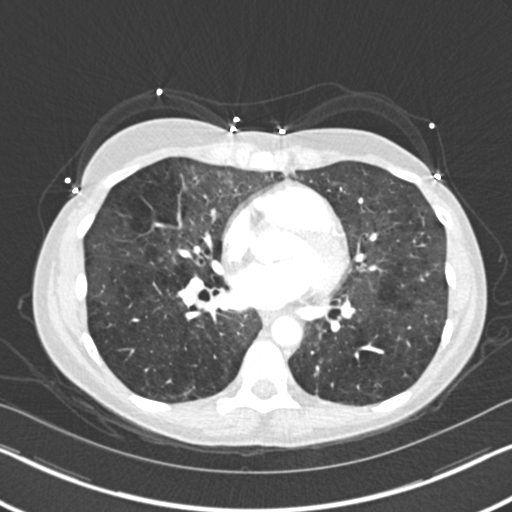
[im 181/403  soft-tissue]
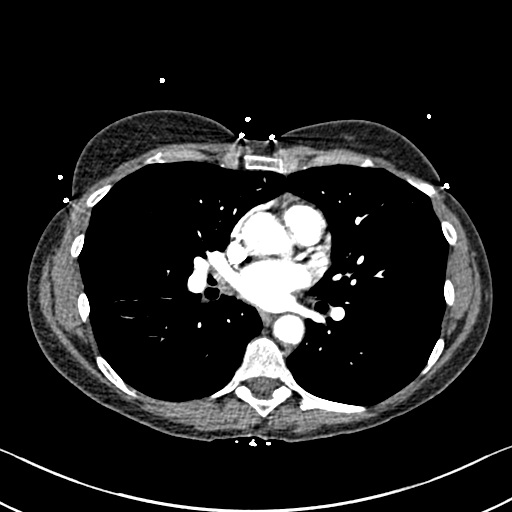
[im 222/403  lung]
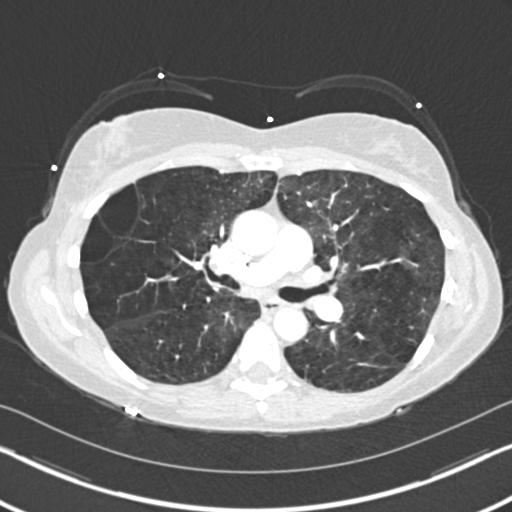
[im 242/403  soft-tissue]
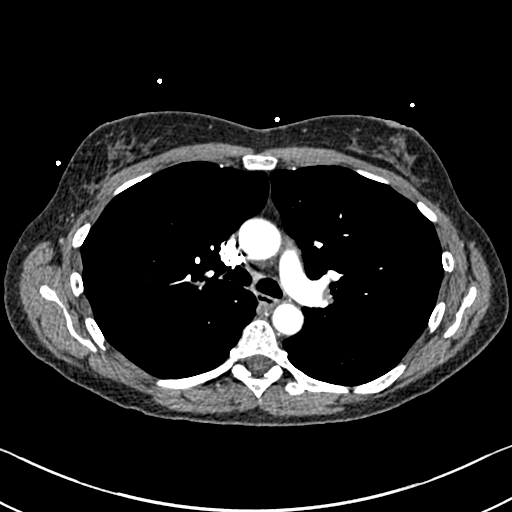
[im 262/403  lung]
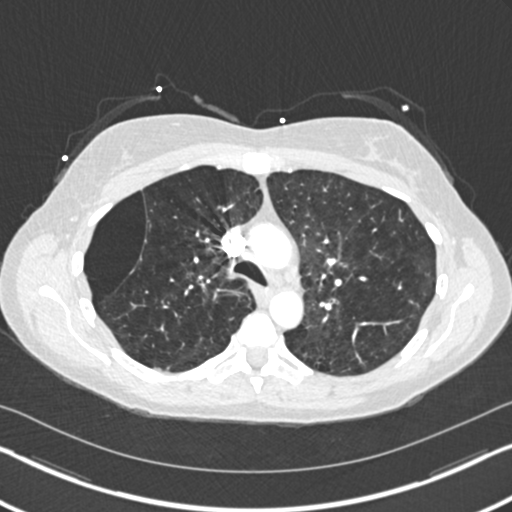
[im 282/403  soft-tissue]
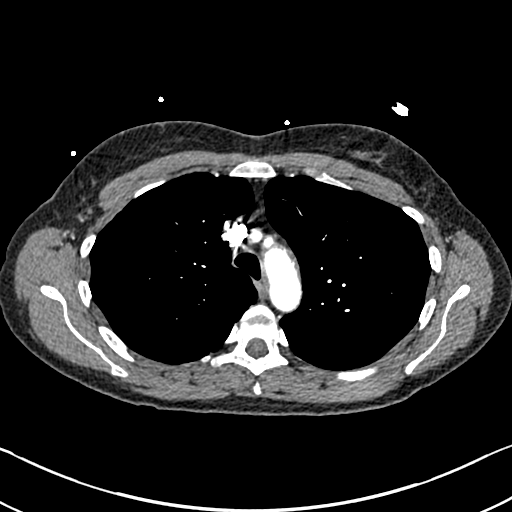
[im 302/403  lung]
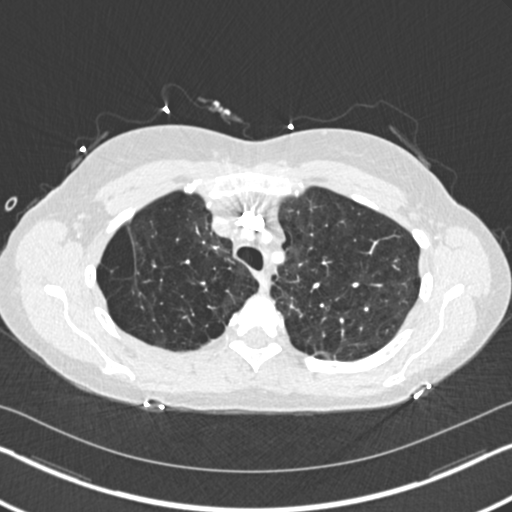
[im 342/403  soft-tissue]
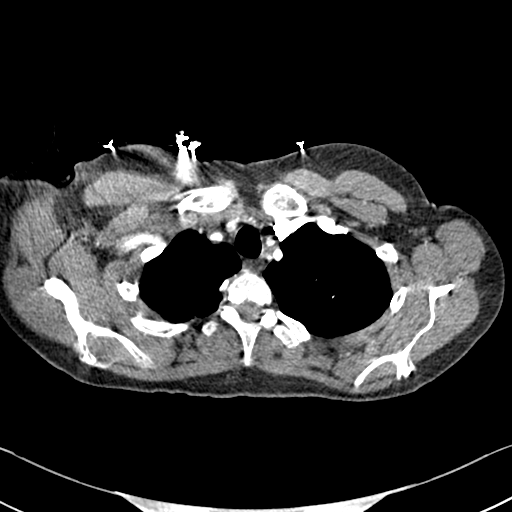
[im 362/403  lung]
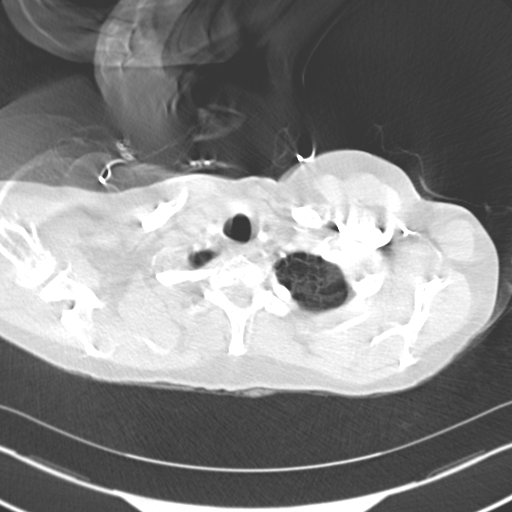
[im 382/403  soft-tissue]
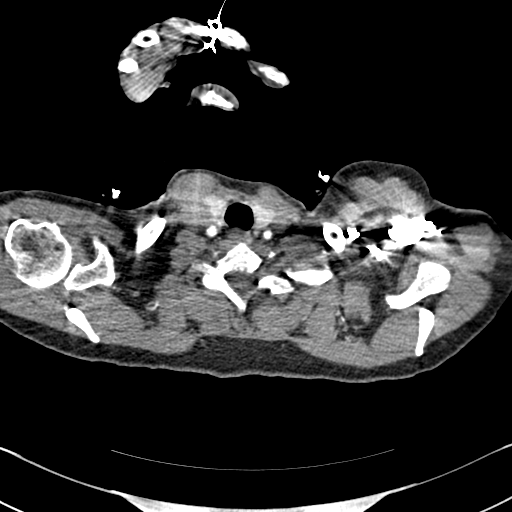

[Series 8: coronal mpr · coronal · 0.63mm/px · 3 of 79 slices shown]
[im 20/79  soft-tissue]
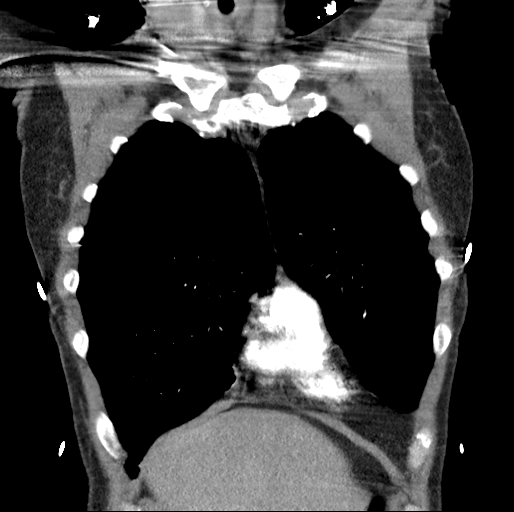
[im 40/79  soft-tissue]
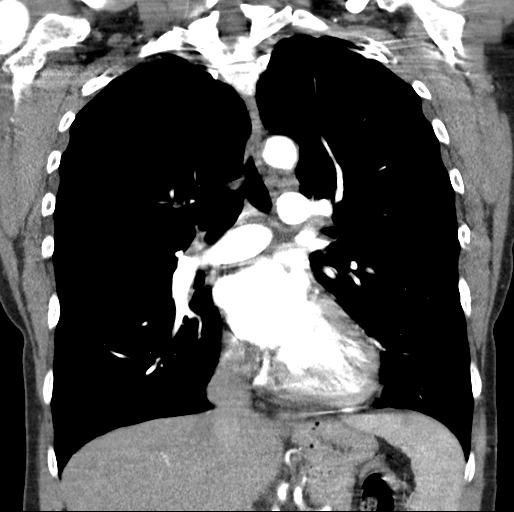
[im 59/79  soft-tissue]
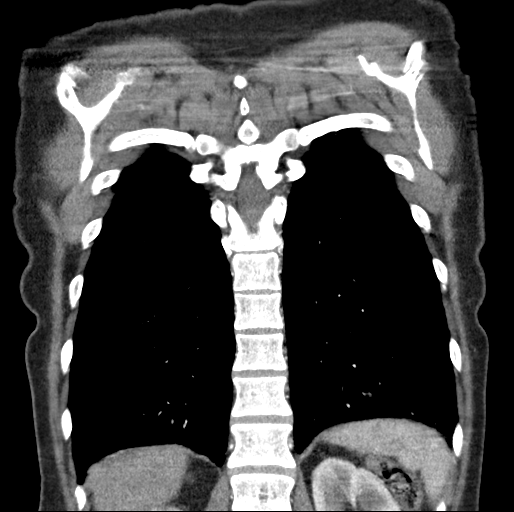

[19 of 46 positions shown; findings below may reference images not displayed]

FINDINGS: Cardiovascular: Satisfactory opacification of the pulmonary arteries
to the segmental level. No evidence of pulmonary embolism. Normal
heart size. No pericardial effusion. Nonaneurysmal aorta. No
dissection is seen. Mild atherosclerosis.

Mediastinum/Nodes: No enlarged mediastinal, hilar, or axillary lymph
nodes. Thyroid gland, trachea, and esophagus demonstrate no
significant findings.

Lungs/Pleura: Advanced emphysema. No acute consolidation, pleural
effusion or pneumothorax.

Upper Abdomen: No acute abnormality.

Musculoskeletal: No chest wall abnormality. No acute or significant
osseous findings.

Review of the MIP images confirms the above findings.
IMPRESSION: 1. Negative for acute pulmonary embolus.
2. Emphysema without acute airspace disease

Aortic Atherosclerosis (2JWBB-CFQ.Q) and Emphysema (2JWBB-BI3.A).

## 2022-07-19 DIAGNOSIS — M47816 Spondylosis without myelopathy or radiculopathy, lumbar region: Secondary | ICD-10-CM | POA: Diagnosis not present

## 2022-07-19 DIAGNOSIS — J439 Emphysema, unspecified: Secondary | ICD-10-CM | POA: Diagnosis not present

## 2022-07-19 DIAGNOSIS — M9953 Intervertebral disc stenosis of neural canal of lumbar region: Secondary | ICD-10-CM | POA: Diagnosis not present

## 2022-07-19 DIAGNOSIS — R110 Nausea: Secondary | ICD-10-CM | POA: Diagnosis not present

## 2022-07-19 DIAGNOSIS — Z981 Arthrodesis status: Secondary | ICD-10-CM | POA: Diagnosis not present

## 2022-07-31 DIAGNOSIS — Z9981 Dependence on supplemental oxygen: Secondary | ICD-10-CM | POA: Diagnosis not present

## 2022-07-31 DIAGNOSIS — R7303 Prediabetes: Secondary | ICD-10-CM | POA: Diagnosis not present

## 2022-07-31 DIAGNOSIS — J432 Centrilobular emphysema: Secondary | ICD-10-CM | POA: Diagnosis not present

## 2022-07-31 DIAGNOSIS — K2960 Other gastritis without bleeding: Secondary | ICD-10-CM | POA: Diagnosis not present

## 2022-07-31 DIAGNOSIS — Z23 Encounter for immunization: Secondary | ICD-10-CM | POA: Diagnosis not present

## 2022-07-31 DIAGNOSIS — R531 Weakness: Secondary | ICD-10-CM | POA: Diagnosis not present

## 2022-07-31 DIAGNOSIS — J418 Mixed simple and mucopurulent chronic bronchitis: Secondary | ICD-10-CM | POA: Diagnosis not present

## 2022-07-31 DIAGNOSIS — K219 Gastro-esophageal reflux disease without esophagitis: Secondary | ICD-10-CM | POA: Diagnosis not present

## 2022-07-31 DIAGNOSIS — E538 Deficiency of other specified B group vitamins: Secondary | ICD-10-CM | POA: Diagnosis not present

## 2022-07-31 DIAGNOSIS — R7989 Other specified abnormal findings of blood chemistry: Secondary | ICD-10-CM | POA: Diagnosis not present

## 2022-07-31 DIAGNOSIS — R5383 Other fatigue: Secondary | ICD-10-CM | POA: Diagnosis not present

## 2022-08-21 IMAGING — CR DG CHEST 2V
2 series · 2 of 2 positions shown · non-contrast
Comparison: 07/08/2021

CLINICAL DATA: Shortness of breath, hospital acquired pneumonia

EXAM:
CHEST - 2 VIEW

[chest pa]
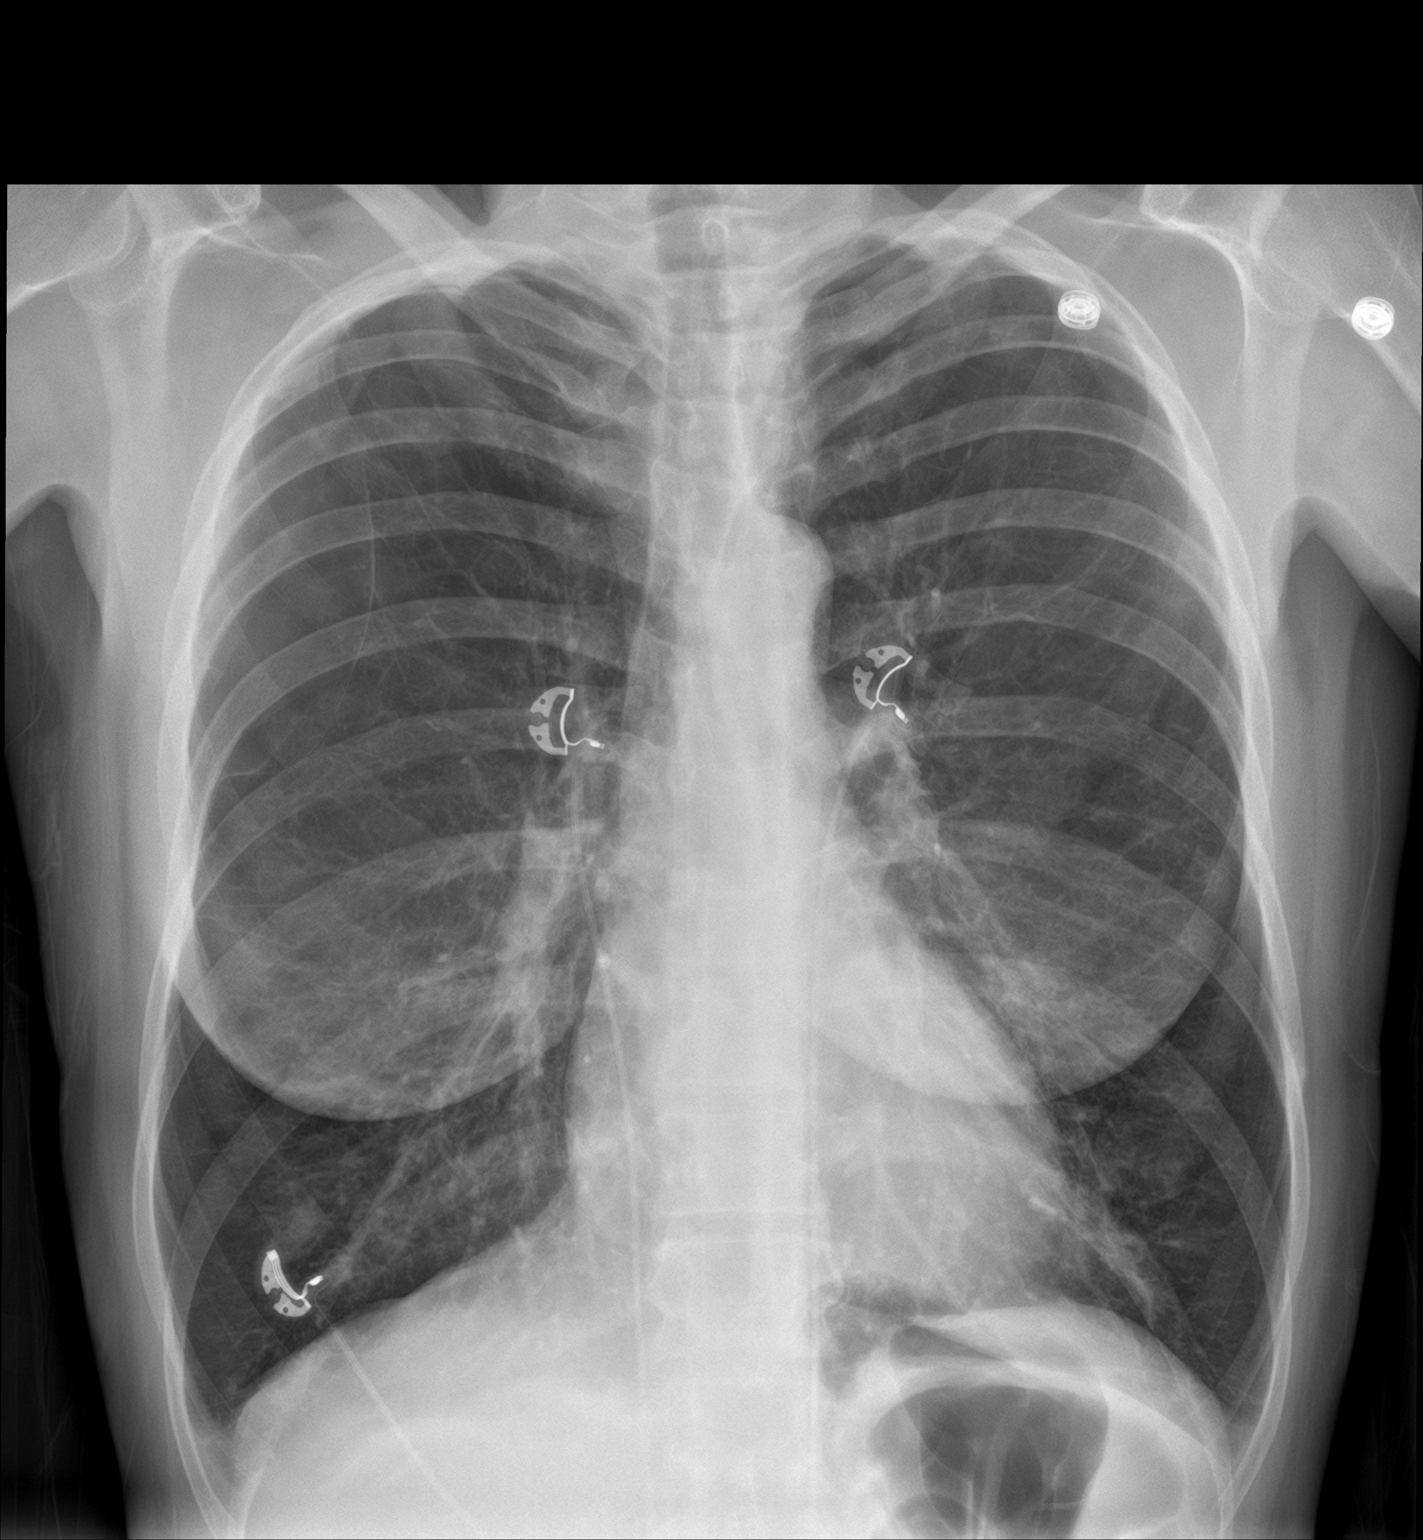

[chest lat]
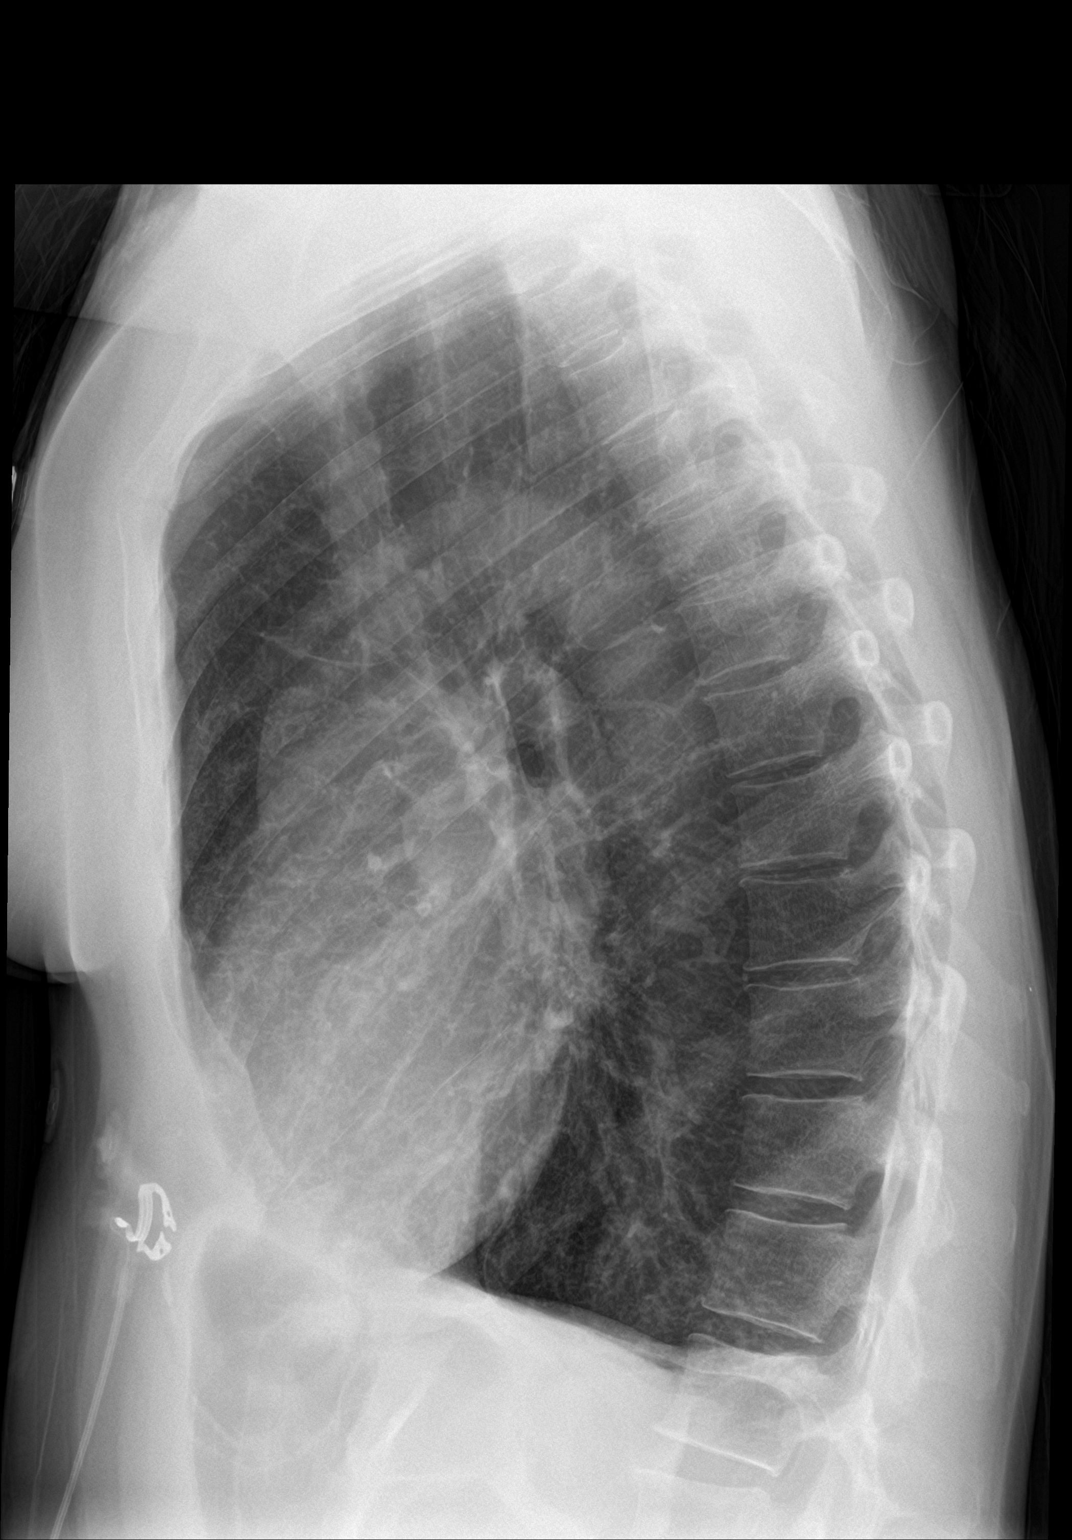

[2 of 2 positions shown; findings below may reference images not displayed]

FINDINGS: Hyperinflation noted with upper lobe predominant emphysema. Bullous
disease in the right upper lobe as before. Basilar vascular
redistribution noted. No definite superimposed acute pneumonia,
collapse or consolidation. Negative for edema, effusion or
pneumothorax. Trachea midline. No acute osseous finding.
IMPRESSION: Chronic emphysema pattern with hyperinflation. No superimposed acute
process by plain radiography.

## 2022-09-08 ENCOUNTER — Emergency Department
Admission: EM | Admit: 2022-09-08 | Discharge: 2022-09-08 | Disposition: A | Discharge status: Home / Self Care | Payer: Medicare HMO | Attending: Emergency Medicine | Admitting: Emergency Medicine

## 2022-09-08 ENCOUNTER — Other Ambulatory Visit: Payer: Self-pay

## 2022-09-08 ENCOUNTER — Emergency Department: Payer: Medicare HMO

## 2022-09-08 DIAGNOSIS — Z1152 Encounter for screening for COVID-19: Secondary | ICD-10-CM | POA: Insufficient documentation

## 2022-09-08 DIAGNOSIS — J441 Chronic obstructive pulmonary disease with (acute) exacerbation: Secondary | ICD-10-CM | POA: Diagnosis not present

## 2022-09-08 DIAGNOSIS — R0602 Shortness of breath: Secondary | ICD-10-CM | POA: Diagnosis present

## 2022-09-08 LAB — URINALYSIS, W/ REFLEX TO CULTURE (INFECTION SUSPECTED)
Bacteria, UA: NONE SEEN
Bilirubin Urine: NEGATIVE
Glucose, UA: NEGATIVE mg/dL
Hgb urine dipstick: NEGATIVE
Ketones, ur: 80 mg/dL — AB
Leukocytes,Ua: NEGATIVE
Nitrite: NEGATIVE
Protein, ur: NEGATIVE mg/dL
Specific Gravity, Urine: 1.019 (ref 1.005–1.030)
pH: 5 (ref 5.0–8.0)

## 2022-09-08 LAB — COMPREHENSIVE METABOLIC PANEL
ALT: 10 U/L (ref 0–44)
AST: 13 U/L — ABNORMAL LOW (ref 15–41)
Albumin: 3.8 g/dL (ref 3.5–5.0)
Alkaline Phosphatase: 58 U/L (ref 38–126)
Anion gap: 11 (ref 5–15)
BUN: 9 mg/dL (ref 6–20)
CO2: 22 mmol/L (ref 22–32)
Calcium: 8.8 mg/dL — ABNORMAL LOW (ref 8.9–10.3)
Chloride: 106 mmol/L (ref 98–111)
Creatinine, Ser: 0.54 mg/dL (ref 0.44–1.00)
GFR, Estimated: >60 mL/min (ref >60)
Glucose, Bld: 123 mg/dL — ABNORMAL HIGH (ref 70–99)
Potassium: 2.8 mmol/L — ABNORMAL LOW (ref 3.5–5.1)
Sodium: 139 mmol/L (ref 135–145)
Total Bilirubin: 1.2 mg/dL (ref 0.3–1.2)
Total Protein: 7 g/dL (ref 6.5–8.1)

## 2022-09-08 LAB — CBC WITH DIFFERENTIAL/PLATELET
Abs Immature Granulocytes: 0.01 10*3/uL (ref 0.00–0.07)
Basophils Absolute: 0 10*3/uL (ref 0.0–0.1)
Basophils Relative: 0 %
Eosinophils Absolute: 0 10*3/uL (ref 0.0–0.5)
Eosinophils Relative: 0 %
HCT: 40.5 % (ref 36.0–46.0)
Hemoglobin: 13.4 g/dL (ref 12.0–15.0)
Immature Granulocytes: 0 %
Lymphocytes Relative: 13 %
Lymphs Abs: 0.9 10*3/uL (ref 0.7–4.0)
MCH: 31.2 pg (ref 26.0–34.0)
MCHC: 33.1 g/dL (ref 30.0–36.0)
MCV: 94.4 fL (ref 80.0–100.0)
Monocytes Absolute: 0.4 10*3/uL (ref 0.1–1.0)
Monocytes Relative: 5 %
Neutro Abs: 5.4 10*3/uL (ref 1.7–7.7)
Neutrophils Relative %: 82 %
Platelets: 101 10*3/uL — ABNORMAL LOW (ref 150–400)
RBC: 4.29 MIL/uL (ref 3.87–5.11)
RDW: 12.3 % (ref 11.5–15.5)
WBC: 6.7 10*3/uL (ref 4.0–10.5)
nRBC: 0 % (ref 0.0–0.2)

## 2022-09-08 LAB — LACTIC ACID, PLASMA: Lactic Acid, Venous: 1.3 mmol/L (ref 0.5–1.9)

## 2022-09-08 LAB — RESP PANEL BY RT-PCR (RSV, FLU A&B, COVID)  RVPGX2
Influenza A by PCR: NEGATIVE
Influenza B by PCR: NEGATIVE
Resp Syncytial Virus by PCR: NEGATIVE
SARS Coronavirus 2 by RT PCR: NEGATIVE

## 2022-09-08 MED ORDER — ACETYLCYSTEINE 20 % IN SOLN
4.0000 mL | Freq: Once | RESPIRATORY_TRACT | Status: AC
  Administered 2022-09-08: 4 mL via RESPIRATORY_TRACT
  Filled 2022-09-08: qty 4

## 2022-09-08 MED ORDER — PREDNISONE 10 MG (21) PO TBPK: ORAL_TABLET | ORAL | 0 refills | Status: DC

## 2022-09-08 MED ORDER — POTASSIUM CHLORIDE CRYS ER 20 MEQ PO TBCR
40.0000 meq | EXTENDED_RELEASE_TABLET | Freq: Once | ORAL | Status: AC
  Administered 2022-09-08: 40 meq via ORAL
  Filled 2022-09-08: qty 2

## 2022-09-08 MED ORDER — DOXYCYCLINE HYCLATE 100 MG PO CAPS: 100.0000 mg | ORAL_CAPSULE | Freq: Two times a day (BID) | ORAL | 0 refills | Status: AC

## 2022-09-08 NOTE — Discharge Instructions (Signed)
Please seek medical attention for any high fevers, chest pain, shortness of breath, change in behavior, persistent vomiting, bloody stool or any other new or concerning symptoms.  

## 2022-09-08 NOTE — ED Provider Notes (Signed)
[facility] Provider Note    Event Date/Time   First MD Initiated Contact with Patient 09/08/22 1821     (approximate)   History   Shortness of Breath   HPI  Grace King is a 52 y.o. female  who presents to the emergency department today because of concern for shortness of breath. The patient states that she has been short of breath for the past few weeks. The patient does have history of COPD and is oxygen at home. The patient took a course of prednisone and doxycyline but did not see any significant improvement. Symptoms have been worse over the past few days. She denies any fevers or chills. 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 Took a home covid test which was negative.     Physical Exam   Triage Vital Signs: ED Triage Vitals  Enc Vitals Group     BP --      Pulse Rate 09/08/22 1822 (!) 101     Resp 09/08/22 1822 18     Temp 09/08/22 1822 99.1 F (37.3 C)     Temp src --      SpO2 09/08/22 1822 97 %     Weight 09/08/22 1823 118 lb (53.5 kg)     Height 09/08/22 1823 5\' 5"  (1.651 m)   Most recent vital signs: Vitals:   09/08/22 1822  Pulse: (!) 101  Resp: 18  Temp: 99.1 F (37.3 C)  SpO2: 97%   General: Awake, alert, oriented. CV:  Good peripheral perfusion. Tachycardia. Resp:  Slightly increased work of breathing. Mild diffuse wheezing. Abd:  No distention.    ED Results / Procedures / Treatments   Labs (all labs ordered are listed, but only abnormal results are displayed) Labs Reviewed  COMPREHENSIVE METABOLIC PANEL - Abnormal; Notable for the following components:      Result Value   Potassium 2.8 (*)    Glucose, Bld 123 (*)    Calcium 8.8 (*)    AST 13 (*)    All other components within normal limits  CBC WITH DIFFERENTIAL/PLATELET - Abnormal; Notable for the following components:   Platelets 101 (*)    All other components within normal limits  URINALYSIS, W/ REFLEX  TO CULTURE (INFECTION SUSPECTED) - Abnormal; Notable for the following components:   Color, Urine YELLOW (*)    APPearance HAZY (*)    Ketones, ur 80 (*)    All other components within normal limits  RESP PANEL BY RT-PCR (RSV, FLU A&B, COVID)  [id number]  CULTURE, BLOOD (ROUTINE X 2)  CULTURE, BLOOD (ROUTINE X 2)  LACTIC ACID, PLASMA  LACTIC ACID, PLASMA     EKG  I, Phineas Semen, attending physician, personally viewed and interpreted this EKG  EKG Time: 1816 Rate: 98 Rhythm: sinus rhythm Axis: normal Intervals: qtc 450 QRS: LVH, RSR' in v1, v2 ST changes: no st elevation Impression: abnormal ekg   RADIOLOGY I independently interpreted and visualized the CXR. My interpretation: No pneumonia Radiology interpretation:  IMPRESSION:  Severe emphysematous changes. No acute cardiopulmonary abnormality.    Given underlying emphysema, recommend evaluation for candidacy for  annual lung cancer screening CT.      PROCEDURES:  Critical Care performed: No    MEDICATIONS ORDERED IN ED: Medications - No data to display   IMPRESSION / MDM / ASSESSMENT AND PLAN / ED COURSE  I reviewed the triage vital signs and  the nursing notes.                              Differential diagnosis includes, but is not limited to, pneumonia, COVID, COPD exacerbation, anemia  Patient's presentation is most consistent with acute presentation with potential threat to life or bodily function.   The patient is on the cardiac monitor to evaluate for evidence of arrhythmia and/or significant heart rate changes.  Patient presented to the emergency department today because of concerns for shortness of breath.  Patient has a history of COPD.  Patient was given Solu-Medrol magnesium as well as breathing treatments by EMS.  Patient was able to be weaned down to her home oxygen here in the emergency department.  Did give patient Mucomyst treatment here given that she had concerns for congestion in her  chest.  She did feel better after this.  Patient was watched in the emergency department for a number of hours without any worsening of her breathing.  She was able to continue to be maintained on her home oxygen.  X-ray without pneumonia or pneumothorax.  COVID and influenza negative.  Did have a discussion with the patient.  At this time given the patient back on her baseline oxygen and in no respiratory distress she did feel comfortable being discharged home.  I think this is reasonable at this time.  Will plan on giving patient prescription for further steroids.      FINAL CLINICAL IMPRESSION(S) / ED DIAGNOSES   Final diagnoses:  COPD exacerbation       Note:  This document was prepared using Dragon voice recognition software and may include unintentional dictation errors.    Phineas Semen, MD 09/08/22 [phone]

## 2022-09-08 NOTE — ED Triage Notes (Signed)
Pt arrives via EMS w/c/o SOB x 3-4 days. Pt was given 500 ml NS, 2 G mag, 125 Solu med, 4 zofran, 3 duo nebs and 1 albuterol by EMS. Pt was seen at her PCP and treated w/ prednisone and doxy. Pt states she finished her taper yesterday and noticed SOB. Pt states she was at home today doing her treatments and had a coughing fit and could not catch her breathe so she called 911. Pt came in on a NRB and is now on 3L [place] which is pts baseline, satting 97%. Pt states her breathing is more controlled at this time. Pt is talking in complete sentences.

## 2022-09-09 LAB — CULTURE, BLOOD (ROUTINE X 2): Culture: NO GROWTH

## 2022-09-10 LAB — CULTURE, BLOOD (ROUTINE X 2): Special Requests: ADEQUATE

## 2022-09-11 LAB — CULTURE, BLOOD (ROUTINE X 2): Special Requests: ADEQUATE

## 2022-09-13 LAB — CULTURE, BLOOD (ROUTINE X 2): Culture: NO GROWTH

## 2023-02-06 ENCOUNTER — Other Ambulatory Visit: Payer: Self-pay

## 2023-02-06 ENCOUNTER — Encounter: Payer: Self-pay | Admitting: Emergency Medicine

## 2023-02-06 ENCOUNTER — Emergency Department: Payer: Medicaid Other

## 2023-02-06 ENCOUNTER — Emergency Department
Admission: EM | Admit: 2023-02-06 | Discharge: 2023-02-06 | Disposition: A | Discharge status: Home / Self Care | Payer: Medicaid Other | Attending: Emergency Medicine | Admitting: Emergency Medicine

## 2023-02-06 DIAGNOSIS — R0602 Shortness of breath: Secondary | ICD-10-CM | POA: Diagnosis present

## 2023-02-06 DIAGNOSIS — J441 Chronic obstructive pulmonary disease with (acute) exacerbation: Secondary | ICD-10-CM | POA: Diagnosis not present

## 2023-02-06 LAB — BASIC METABOLIC PANEL
Anion gap: 10 (ref 5–15)
BUN: 13 mg/dL (ref 6–20)
CO2: 24 mmol/L (ref 22–32)
Calcium: 9 mg/dL (ref 8.9–10.3)
Chloride: 104 mmol/L (ref 98–111)
Creatinine, Ser: 0.74 mg/dL (ref 0.44–1.00)
GFR, Estimated: >60 mL/min (ref >60)
Glucose, Bld: 115 mg/dL — ABNORMAL HIGH (ref 70–99)
Potassium: 3.4 mmol/L — ABNORMAL LOW (ref 3.5–5.1)
Sodium: 138 mmol/L (ref 135–145)

## 2023-02-06 LAB — TROPONIN I (HIGH SENSITIVITY): Troponin I (High Sensitivity): 5 ng/L (ref <18)

## 2023-02-06 LAB — CBC
HCT: 40.1 % (ref 36.0–46.0)
Hemoglobin: 13.1 g/dL (ref 12.0–15.0)
MCH: 30.8 pg (ref 26.0–34.0)
MCHC: 32.7 g/dL (ref 30.0–36.0)
MCV: 94.1 fL (ref 80.0–100.0)
Platelets: 155 10*3/uL (ref 150–400)
RBC: 4.26 MIL/uL (ref 3.87–5.11)
RDW: 12.7 % (ref 11.5–15.5)
WBC: 5.3 10*3/uL (ref 4.0–10.5)
nRBC: 0 % (ref 0.0–0.2)

## 2023-02-06 MED ORDER — DOXYCYCLINE HYCLATE 100 MG PO CAPS: 100.0000 mg | ORAL_CAPSULE | Freq: Two times a day (BID) | ORAL | 0 refills | Status: AC

## 2023-02-06 NOTE — ED Triage Notes (Signed)
Patient to ED via [facility] from work for SOB x3 days. Hx of COPD and wears 2L at baseline. Seen PCP and given prednisone and breathing tx. PT reports taking 6 breathing treatments PTA. EMS gave solumedrol and 1 duo-neb. Pt reports chest still felling tight.  20 R Hand

## 2023-02-06 NOTE — ED Notes (Signed)
See triage notes. Patient stated she was at work and had what she believes was an asthma attack. Patient has her inhaler with her. Patient stated she is feeling better

## 2023-02-06 NOTE — ED Provider Notes (Signed)
[facility] Provider Note    Event Date/Time   First MD Initiated Contact with Patient 02/06/23 1746     (approximate)   History   Chief Complaint Shortness of Breath   HPI  Grace King is a 52 y.o. female with past medical history of COPD, chronic hypoxic respiratory failure on 2 L nasal cannula, and SLE who presents to the ED complaining of shortness of breath.  Patient reports that she began to have increasing breathing difficulties starting last night, with cough productive of greenish sputum.  She denies any associated fevers, does state that breathing seem to get acutely worse this afternoon and so she sought care in the ED.  She denies any pain in her chest.  She was given IV Solu-Medrol with EMS as well as a DuoNeb, now states that her breathing feels back to normal.     Physical Exam   Triage Vital Signs: ED Triage Vitals [02/06/23 1520]  Encounter Vitals Group     BP (!) 147/91     Systolic BP Percentile      Diastolic BP Percentile      Pulse Rate (!) 108     Resp (!) 22     Temp 99 F (37.2 C)     Temp Source Oral     SpO2 99 %     Weight 118 lb (53.5 kg)     Height 5\' 5"  (1.651 m)     Head Circumference      Peak Flow      Pain Score 0     Pain Loc      Pain Education      Exclude from Growth Chart     Most recent vital signs: Vitals:   02/06/23 1520  BP: (!) 147/91  Pulse: (!) 108  Resp: (!) 22  Temp: 99 F (37.2 C)  SpO2: 99%    Constitutional: Alert and oriented. Eyes: Conjunctivae are normal. Head: Atraumatic. Nose: No congestion/rhinnorhea. Mouth/Throat: Mucous membranes are moist.  Cardiovascular: Normal rate, regular rhythm. Grossly normal heart sounds.  2+ radial pulses bilaterally. Respiratory: Normal respiratory effort.  No retractions. Lungs CTAB. Gastrointestinal: Soft and nontender. No distention. Musculoskeletal: No lower extremity tenderness nor edema.  Neurologic:  Normal speech and language. No  gross focal neurologic deficits are appreciated.    ED Results / Procedures / Treatments   Labs (all labs ordered are listed, but only abnormal results are displayed) Labs Reviewed  BASIC METABOLIC PANEL - Abnormal; Notable for the following components:      Result Value   Potassium 3.4 (*)    Glucose, Bld 115 (*)    All other components within normal limits  CBC  TROPONIN I (HIGH SENSITIVITY)     EKG  ED ECG REPORT I, Chesley Noon, the attending physician, personally viewed and interpreted this ECG.   Date: 02/06/2023  EKG Time: 15:19  Rate: 107  Rhythm: sinus tachycardia  Axis: Normal  Intervals: Incomplete RBBB  ST&T Change: None  RADIOLOGY Chest x-ray reviewed and interpreted by me with no infiltrate, edema, or effusion.  PROCEDURES:  Critical Care performed: No  Procedures   MEDICATIONS ORDERED IN ED: Medications - No data to display   IMPRESSION / MDM / ASSESSMENT AND PLAN / ED COURSE  I reviewed the triage vital signs and the nursing notes.  52 y.o. female with past medical history of COPD on 2 L and SLE who presents to the ED complaining of cough corrective of greenish sputum with increasing difficulty breathing since last night.  Patient's presentation is most consistent with acute presentation with potential threat to life or bodily function.  Differential diagnosis includes, but is not limited to, COPD exacerbation, pneumonia, ACS, PE, anemia, electrolyte abnormality, AKI.  Patient nontoxic-appearing and in no acute distress, vital signs initially remarkable for tachycardia and mild tachypnea.  Patient received IV Solu-Medrol with EMS along with DuoNebs, now states her breathing feels back to normal.  No wheezing on my assessment and patient breathing comfortably on her usual 2 L nasal cannula.  EKG shows no evidence of arrhythmia or ischemia, no significant anemia, leukocytosis, lecture abnormality, or AKI noted.   Troponin within normal limits and I doubt ACS or PE, especially given resolution of her symptoms.  Patient appropriate for outpatient management, she was previously prescribed prednisone by her PCP and was counseled to continue this.  We will start her on doxycycline given purulent sputum production.  She was counseled to follow-up with PCP and to return to the ED for new or worsening symptoms.  Patient agrees with plan.      FINAL CLINICAL IMPRESSION(S) / ED DIAGNOSES   Final diagnoses:  COPD exacerbation (HCC)     Rx / DC Orders   ED Discharge Orders          Ordered    doxycycline (VIBRAMYCIN) 100 MG capsule  2 times daily        02/06/23 1941             Note:  This document was prepared using Dragon voice recognition software and may include unintentional dictation errors.   Chesley Noon, MD 02/06/23 [phone]

## 2023-10-28 ENCOUNTER — Emergency Department
Admission: EM | Admit: 2023-10-28 | Discharge: 2023-10-28 | Disposition: A | Discharge status: Home / Self Care | Attending: Emergency Medicine | Admitting: Emergency Medicine

## 2023-10-28 ENCOUNTER — Emergency Department

## 2023-10-28 ENCOUNTER — Other Ambulatory Visit: Payer: Self-pay

## 2023-10-28 DIAGNOSIS — R0602 Shortness of breath: Secondary | ICD-10-CM | POA: Diagnosis present

## 2023-10-28 DIAGNOSIS — J441 Chronic obstructive pulmonary disease with (acute) exacerbation: Secondary | ICD-10-CM | POA: Diagnosis not present

## 2023-10-28 DIAGNOSIS — I10 Essential (primary) hypertension: Secondary | ICD-10-CM | POA: Diagnosis not present

## 2023-10-28 LAB — CBC WITH DIFFERENTIAL/PLATELET
Abs Immature Granulocytes: 0.01 10*3/uL (ref 0.00–0.07)
Basophils Absolute: 0.1 10*3/uL (ref 0.0–0.1)
Basophils Relative: 1 %
Eosinophils Absolute: 0.4 10*3/uL (ref 0.0–0.5)
Eosinophils Relative: 6 %
HCT: 41.7 % (ref 36.0–46.0)
Hemoglobin: 13.5 g/dL (ref 12.0–15.0)
Immature Granulocytes: 0 %
Lymphocytes Relative: 35 %
Lymphs Abs: 2.1 10*3/uL (ref 0.7–4.0)
MCH: 30 pg (ref 26.0–34.0)
MCHC: 32.4 g/dL (ref 30.0–36.0)
MCV: 92.7 fL (ref 80.0–100.0)
Monocytes Absolute: 0.5 10*3/uL (ref 0.1–1.0)
Monocytes Relative: 8 %
Neutro Abs: 2.9 10*3/uL (ref 1.7–7.7)
Neutrophils Relative %: 50 %
Platelets: 172 10*3/uL (ref 150–400)
RBC: 4.5 MIL/uL (ref 3.87–5.11)
RDW: 12.6 % (ref 11.5–15.5)
WBC: 5.9 10*3/uL (ref 4.0–10.5)
nRBC: 0 % (ref 0.0–0.2)

## 2023-10-28 LAB — TROPONIN I (HIGH SENSITIVITY): Troponin I (High Sensitivity): 5 ng/L (ref <18)

## 2023-10-28 LAB — BASIC METABOLIC PANEL WITH GFR
Anion gap: 8 (ref 5–15)
BUN: 12 mg/dL (ref 6–20)
CO2: 31 mmol/L (ref 22–32)
Calcium: 8.7 mg/dL — ABNORMAL LOW (ref 8.9–10.3)
Chloride: 103 mmol/L (ref 98–111)
Creatinine, Ser: 0.67 mg/dL (ref 0.44–1.00)
GFR, Estimated: >60 mL/min (ref >60)
Glucose, Bld: 121 mg/dL — ABNORMAL HIGH (ref 70–99)
Potassium: 4 mmol/L (ref 3.5–5.1)
Sodium: 142 mmol/L (ref 135–145)

## 2023-10-28 MED ORDER — ALBUTEROL SULFATE (2.5 MG/3ML) 0.083% IN NEBU: 2.5000 mg | INHALATION_SOLUTION | RESPIRATORY_TRACT | 2 refills | Status: AC | PRN

## 2023-10-28 MED ORDER — DOXYCYCLINE HYCLATE 100 MG PO CAPS: 100.0000 mg | ORAL_CAPSULE | Freq: Two times a day (BID) | ORAL | 0 refills | Status: AC

## 2023-10-28 MED ORDER — PREDNISONE 10 MG PO TABS
ORAL_TABLET | ORAL | 0 refills | Status: AC
Start: 2023-10-28 — End: 2023-11-09

## 2023-10-28 NOTE — ED Provider Notes (Signed)
 [facility] Provider Note    Event Date/Time   First MD Initiated Contact with Patient 10/28/23 1048     (approximate)   History   Chief Complaint Shortness of Breath   HPI  Grace King is a 53 y.o. female with past medical history of hypertension, SLE, COPD, and chronic hypoxic respiratory failure on 2 L who presents to the ED complaining of shortness of breath.  Patient reports that she has been dealing with a dry cough and increasing difficulty breathing over the past 5 days.  She was prescribed prednisone  by her PCP and has been using nebulizer at home with minimal relief.  On EMS arrival, patient noted to be hypoxic to the 70s, was given 125 mg IV Solu-Medrol , 2 g IV magnesium , and 2 additional albuterol .  Patient reports breathing improved at this time and she denies any pain in her chest.     Physical Exam   Triage Vital Signs: ED Triage Vitals  Encounter Vitals Group     BP      Systolic BP Percentile      Diastolic BP Percentile      Pulse      Resp      Temp      Temp src      SpO2      Weight      Height      Head Circumference      Peak Flow      Pain Score      Pain Loc      Pain Education      Exclude from Growth Chart     Most recent vital signs: Vitals:   10/28/23 1052 10/28/23 1100  BP:  (!) 149/86  Pulse: 93 88  Resp: 18 20  Temp: 98.7 F (37.1 C)   SpO2: 100% 100%    Constitutional: Alert and oriented. Eyes: Conjunctivae are normal. Head: Atraumatic. Nose: No congestion/rhinnorhea. Mouth/Throat: Mucous membranes are moist.  Cardiovascular: Normal rate, regular rhythm. Grossly normal heart sounds.  2+ radial pulses bilaterally. Respiratory: Normal respiratory effort.  No retractions. Lungs with end expiratory wheezing. Gastrointestinal: Soft and nontender. No distention. Musculoskeletal: No lower extremity tenderness nor edema.  Neurologic:  Normal speech and language. No gross focal neurologic deficits are  appreciated.    ED Results / Procedures / Treatments   Labs (all labs ordered are listed, but only abnormal results are displayed) Labs Reviewed  BASIC METABOLIC PANEL WITH GFR - Abnormal; Notable for the following components:      Result Value   Glucose, Bld 121 (*)    Calcium 8.7 (*)    All other components within normal limits  CBC WITH DIFFERENTIAL/PLATELET  TROPONIN I (HIGH SENSITIVITY)     EKG  ED ECG REPORT I, Twilla Galea, the attending physician, personally viewed and interpreted this ECG.   Date: 10/28/2023  EKG Time: 10:54  Rate: 87  Rhythm: normal sinus rhythm  Axis: Normal  Intervals:none  ST&T Change: None  RADIOLOGY Chest x-ray reviewed and interpreted by me with no infiltrate, edema, or effusion.  PROCEDURES:  Critical Care performed: No  Procedures   MEDICATIONS ORDERED IN ED: Medications - No data to display   IMPRESSION / MDM / ASSESSMENT AND PLAN / ED COURSE  I reviewed the triage vital signs and the nursing notes.  53 y.o. female with past medical history of hypertension, SLE, COPD, and chronic hypoxic respiratory failure who presents to the ED complaining of 5 days of increasing difficulty breathing with a cough.  Patient's presentation is most consistent with acute presentation with potential threat to life or bodily function.  Differential diagnosis includes, but is not limited to, ACS, PE, pneumonia, COPD exacerbation, CHF, anemia, electrolyte abnormality, AKI.  Patient nontoxic-appearing and in no acute distress, vital signs are unremarkable and she is currently at 100% oxygen  saturation on 3 L.  Mild wheezing noted at this time, chest x-ray unremarkable and EKG shows no evidence arrhythmia or ischemia.  Lab results are pending, will reassess following additional results.  Labs without significant anemia, leukocytosis, electrolyte abnormality, or AKI.  Troponin within normal limits and I doubt ACS or  PE.  Patient reports that her breathing feels much better on reassessment, minimal wheezing noted at this time and she was weaned back to her usual 2 L nasal cannula.  She is appropriate for discharge home with outpatient follow-up, will prescribe prednisone  taper and antibiotics.  She was counseled to return to the ED for new or worsening symptoms.  Patient agrees with plan.      FINAL CLINICAL IMPRESSION(S) / ED DIAGNOSES   Final diagnoses:  COPD exacerbation (HCC)     Rx / DC Orders   ED Discharge Orders          Ordered    predniSONE  (DELTASONE ) 10 MG tablet  Daily        10/28/23 1217    albuterol  (PROVENTIL ) (2.5 MG/3ML) 0.083% nebulizer solution  Every 4 hours PRN        10/28/23 1217    doxycycline  (VIBRAMYCIN ) 100 MG capsule  2 times daily        10/28/23 1217             Note:  This document was prepared using Dragon voice recognition software and may include unintentional dictation errors.   Twilla Galea, MD 10/28/23 [phone]

## 2023-10-28 NOTE — ED Triage Notes (Addendum)
 Pt from home via EMS for [facility]- O2 was 70% on arrival during nebulizer treatment.  1 neb per arrival, pt can't have duonebs due to peanut allergy. Pt on 2-3 liter's chronic, [place] placed on at 3 liter's upon arrival.  2 grams of mag and D5 at 1041 am 20 left AC Pt on prednisone  course CBG- 75  163/114

## 2023-12-03 ENCOUNTER — Inpatient Hospital Stay
Admission: EM | Admit: 2023-12-03 | Discharge: 2023-12-06 | DRG: 189 | Disposition: A | Discharge status: Home / Self Care | Attending: Internal Medicine | Admitting: Internal Medicine

## 2023-12-03 ENCOUNTER — Emergency Department

## 2023-12-03 DIAGNOSIS — J9621 Acute and chronic respiratory failure with hypoxia: Principal | ICD-10-CM | POA: Diagnosis present

## 2023-12-03 DIAGNOSIS — Z1152 Encounter for screening for COVID-19: Secondary | ICD-10-CM

## 2023-12-03 DIAGNOSIS — F17210 Nicotine dependence, cigarettes, uncomplicated: Secondary | ICD-10-CM | POA: Diagnosis present

## 2023-12-03 DIAGNOSIS — J441 Chronic obstructive pulmonary disease with (acute) exacerbation: Principal | ICD-10-CM | POA: Diagnosis present

## 2023-12-03 DIAGNOSIS — K219 Gastro-esophageal reflux disease without esophagitis: Secondary | ICD-10-CM | POA: Diagnosis present

## 2023-12-03 DIAGNOSIS — R0603 Acute respiratory distress: Secondary | ICD-10-CM

## 2023-12-03 DIAGNOSIS — Z91010 Allergy to peanuts: Secondary | ICD-10-CM

## 2023-12-03 DIAGNOSIS — E876 Hypokalemia: Secondary | ICD-10-CM | POA: Diagnosis present

## 2023-12-03 DIAGNOSIS — Z7951 Long term (current) use of inhaled steroids: Secondary | ICD-10-CM

## 2023-12-03 DIAGNOSIS — R110 Nausea: Secondary | ICD-10-CM

## 2023-12-03 DIAGNOSIS — Z9981 Dependence on supplemental oxygen: Secondary | ICD-10-CM

## 2023-12-03 DIAGNOSIS — J9601 Acute respiratory failure with hypoxia: Secondary | ICD-10-CM | POA: Diagnosis present

## 2023-12-03 DIAGNOSIS — G4700 Insomnia, unspecified: Secondary | ICD-10-CM | POA: Diagnosis present

## 2023-12-03 DIAGNOSIS — I10 Essential (primary) hypertension: Secondary | ICD-10-CM | POA: Diagnosis present

## 2023-12-03 LAB — CBC WITH DIFFERENTIAL/PLATELET
Abs Immature Granulocytes: 0.02 10*3/uL (ref 0.00–0.07)
Basophils Absolute: 0.1 10*3/uL (ref 0.0–0.1)
Basophils Relative: 1 %
Eosinophils Absolute: 0.2 10*3/uL (ref 0.0–0.5)
Eosinophils Relative: 3 %
HCT: 43.3 % (ref 36.0–46.0)
Hemoglobin: 14.2 g/dL (ref 12.0–15.0)
Immature Granulocytes: 0 %
Lymphocytes Relative: 27 %
Lymphs Abs: 2 10*3/uL (ref 0.7–4.0)
MCH: 30.2 pg (ref 26.0–34.0)
MCHC: 32.8 g/dL (ref 30.0–36.0)
MCV: 92.1 fL (ref 80.0–100.0)
Monocytes Absolute: 0.7 10*3/uL (ref 0.1–1.0)
Monocytes Relative: 10 %
Neutro Abs: 4.4 10*3/uL (ref 1.7–7.7)
Neutrophils Relative %: 59 %
Platelets: 179 10*3/uL (ref 150–400)
RBC: 4.7 MIL/uL (ref 3.87–5.11)
RDW: 13 % (ref 11.5–15.5)
WBC: 7.3 10*3/uL (ref 4.0–10.5)
nRBC: 0 % (ref 0.0–0.2)

## 2023-12-03 LAB — BLOOD GAS, VENOUS
Acid-Base Excess: 4.9 mmol/L — ABNORMAL HIGH (ref 0.0–2.0)
Bicarbonate: 29.2 mmol/L — ABNORMAL HIGH (ref 20.0–28.0)
O2 Saturation: 84.3 %
Patient temperature: 37
pCO2, Ven: 41 mmHg — ABNORMAL LOW (ref 44–60)
pH, Ven: 7.46 — ABNORMAL HIGH (ref 7.25–7.43)
pO2, Ven: 47 mmHg — ABNORMAL HIGH (ref 32–45)

## 2023-12-03 LAB — COMPREHENSIVE METABOLIC PANEL WITH GFR
ALT: 17 U/L (ref 0–44)
AST: 18 U/L (ref 15–41)
Albumin: 4.2 g/dL (ref 3.5–5.0)
Alkaline Phosphatase: 69 U/L (ref 38–126)
Anion gap: 12 (ref 5–15)
BUN: 8 mg/dL (ref 6–20)
CO2: 25 mmol/L (ref 22–32)
Calcium: 9.4 mg/dL (ref 8.9–10.3)
Chloride: 99 mmol/L (ref 98–111)
Creatinine, Ser: 0.66 mg/dL (ref 0.44–1.00)
GFR, Estimated: >60 mL/min (ref >60)
Glucose, Bld: 102 mg/dL — ABNORMAL HIGH (ref 70–99)
Potassium: 3.2 mmol/L — ABNORMAL LOW (ref 3.5–5.1)
Sodium: 136 mmol/L (ref 135–145)
Total Bilirubin: 0.9 mg/dL (ref 0.0–1.2)
Total Protein: 6.8 g/dL (ref 6.5–8.1)

## 2023-12-03 MED ORDER — ONDANSETRON HCL 4 MG/2ML IJ SOLN
4.0000 mg | Freq: Once | INTRAMUSCULAR | Status: AC
  Administered 2023-12-03: 4 mg via INTRAVENOUS
  Filled 2023-12-03: qty 2

## 2023-12-03 MED ORDER — METHYLPREDNISOLONE SODIUM SUCC 125 MG IJ SOLR
125.0000 mg | Freq: Once | INTRAMUSCULAR | Status: AC
  Administered 2023-12-03: 125 mg via INTRAVENOUS
  Filled 2023-12-03: qty 2

## 2023-12-03 MED ORDER — SODIUM CHLORIDE 0.9 % IV SOLN
12.5000 mg | Freq: Once | INTRAVENOUS | Status: AC
  Administered 2023-12-03: 12.5 mg via INTRAVENOUS
  Filled 2023-12-03: qty 12.5

## 2023-12-03 MED ORDER — IPRATROPIUM-ALBUTEROL 0.5-2.5 (3) MG/3ML IN SOLN
3.0000 mL | Freq: Once | RESPIRATORY_TRACT | Status: AC
  Administered 2023-12-03: 3 mL via RESPIRATORY_TRACT
  Filled 2023-12-03: qty 3

## 2023-12-03 MED ORDER — LORAZEPAM 2 MG/ML IJ SOLN
1.0000 mg | Freq: Once | INTRAMUSCULAR | Status: AC
  Administered 2023-12-03: 1 mg via INTRAVENOUS
  Filled 2023-12-03: qty 1

## 2023-12-03 MED ORDER — MAGNESIUM SULFATE 2 GM/50ML IV SOLN
2.0000 g | Freq: Once | INTRAVENOUS | Status: AC
  Administered 2023-12-03: 2 g via INTRAVENOUS
  Filled 2023-12-03: qty 50

## 2023-12-03 NOTE — ED Provider Notes (Signed)
-----------------------------------------   11:34 PM on 12/03/2023 -----------------------------------------  Assuming care from Dr. Willo.  In short, Grace King is a 53 y.o. female with a chief complaint of dyspnea.  Refer to the original H&P for additional details.  The current plan of care is to admit after labs are back.   Clinical Course as of 12/04/23 0649  Wed Dec 04, 2023  0004 I am consulting the hospitalist team for admission.  Calling lab regarding missing troponin and BNP. [CF]  0010 I consulted by phone with the admitting hospitalist, and they will admit the patient - Dr. Lawence. [CF]    Clinical Course User Index [CF] Gordan Huxley, MD     Medications  enoxaparin  (LOVENOX ) injection 40 mg (has no administration in time range)  0.9 %  sodium chloride  infusion ( Intravenous New Bag/Given 12/04/23 0318)  acetaminophen  (TYLENOL ) tablet 650 mg (has no administration in time range)    Or  acetaminophen  (TYLENOL ) suppository 650 mg (has no administration in time range)  traZODone  (DESYREL ) tablet 25 mg (has no administration in time range)  magnesium  hydroxide (MILK OF MAGNESIA) suspension 30 mL (has no administration in time range)  ondansetron  (ZOFRAN ) tablet 4 mg ( Oral See Alternative 12/04/23 0459)    Or  ondansetron  (ZOFRAN ) injection 4 mg (4 mg Intravenous Given 12/04/23 0459)  cefTRIAXone  (ROCEPHIN ) 1 g in sodium chloride  0.9 % 100 mL IVPB (0 g Intravenous Stopped 12/04/23 0415)  methylPREDNISolone  sodium succinate (SOLU-MEDROL ) 40 mg/mL injection 40 mg (has no administration in time range)    Followed by  predniSONE  (DELTASONE ) tablet 40 mg (has no administration in time range)  oxyCODONE -acetaminophen  (PERCOCET/ROXICET) 5-325 MG per tablet 1 tablet (1 tablet Oral Given 12/04/23 0456)  ondansetron  (ZOFRAN ) injection 4 mg (4 mg Intravenous Given 12/03/23 2227)  methylPREDNISolone  sodium succinate (SOLU-MEDROL ) 125 mg/2 mL injection 125 mg (125 mg Intravenous Given 12/03/23 2234)   magnesium  sulfate IVPB 2 g 50 mL (0 g Intravenous Stopped 12/04/23 0023)  ipratropium-albuterol  (DUONEB) 0.5-2.5 (3) MG/3ML nebulizer solution 3 mL (3 mLs Nebulization Given 12/03/23 2235)  LORazepam (ATIVAN) injection 1 mg (1 mg Intravenous Given 12/03/23 2250)  promethazine  (PHENERGAN ) 12.5 mg in sodium chloride  0.9 % 50 mL IVPB (0 mg Intravenous Stopped 12/04/23 0023)     ED Discharge Orders     None      Final diagnoses:  COPD exacerbation (HCC)  Respiratory distress  Hypertension, unspecified type  Nausea     Gordan Huxley, MD 12/04/23 [phone]

## 2023-12-03 NOTE — ED Provider Notes (Signed)
 [facility] Provider Note    Event Date/Time   First MD Initiated Contact with Patient 12/03/23 2225     (approximate)   History   Chief Complaint Shortness of Breath   HPI  Grace King is a 53 y.o. female with past medical history of COPD, chronic hypoxic respiratory failure on 2 L, and SLE who presents to the ED complaining of shortness of breath.  Patient reports that she has been having increasing difficulty breathing over the past 3 days, getting acutely worse this evening.  She denies any associated fevers or cough, has not had any pain in her chest, and has not noticed any pain or swelling in her legs.  She describes symptoms as similar to prior COPD exacerbations and significant wheezing was noted with EMS.  She was given 2 DuoNebs prior to arrival, continues to report significant difficulty breathing.      Physical Exam   Triage Vital Signs: ED Triage Vitals  Encounter Vitals Group     BP 12/03/23 2217 (!) 167/105     Girls Systolic BP Percentile --      Girls Diastolic BP Percentile --      Boys Systolic BP Percentile --      Boys Diastolic BP Percentile --      Pulse Rate 12/03/23 2217 (!) 128     Resp 12/03/23 2217 (!) 25     Temp 12/03/23 2217 98.3 F (36.8 C)     Temp Source 12/03/23 2217 Oral     SpO2 12/03/23 2217 100 %     Weight --      Height --      Head Circumference --      Peak Flow --      Pain Score 12/03/23 2216 0     Pain Loc --      Pain Education --      Exclude from Growth Chart --     Most recent vital signs: Vitals:   12/03/23 2230 12/03/23 2300  BP: (!) 154/115 (!) 169/107  Pulse: (!) 122 (!) 106  Resp: (!) 28 20  Temp:    SpO2: 100% 100%    Constitutional: Alert and oriented. Eyes: Conjunctivae are normal. Head: Atraumatic. Nose: No congestion/rhinnorhea. Mouth/Throat: Mucous membranes are moist.  Cardiovascular: Tachycardic, regular rhythm. Grossly normal heart sounds.  2+ radial pulses  bilaterally. Respiratory: Tachypneic with increased work of breathing, accessory muscle use noted with very poor air movement throughout and expiratory wheezing. Gastrointestinal: Soft and nontender. No distention. Musculoskeletal: No lower extremity tenderness nor edema.  Neurologic:  Normal speech and language. No gross focal neurologic deficits are appreciated.    ED Results / Procedures / Treatments   Labs (all labs ordered are listed, but only abnormal results are displayed) Labs Reviewed  COMPREHENSIVE METABOLIC PANEL WITH GFR - Abnormal; Notable for the following components:      Result Value   Potassium 3.2 (*)    Glucose, Bld 102 (*)    All other components within normal limits  BLOOD GAS, VENOUS - Abnormal; Notable for the following components:   pH, Ven 7.46 (*)    pCO2, Ven 41 (*)    pO2, Ven 47 (*)    Bicarbonate 29.2 (*)    Acid-Base Excess 4.9 (*)    All other components within normal limits  CBC WITH DIFFERENTIAL/PLATELET  BRAIN NATRIURETIC PEPTIDE  TROPONIN I (HIGH SENSITIVITY)  TROPONIN I (HIGH SENSITIVITY)     EKG  ED  ECG REPORT I, Carlin Palin, the attending physician, personally viewed and interpreted this ECG.   Date: 12/03/2023  EKG Time: 22:19  Rate: 125  Rhythm: sinus tachycardia  Axis: Normal  Intervals:none  ST&T Change: Nonspecific ST abnormalities  RADIOLOGY Chest x-ray reviewed and interpreted by me with no infiltrate, edema, or effusion.  PROCEDURES:  Critical Care performed: Yes, see critical care procedure note(s)  .Critical Care  Performed by: Palin Carlin, MD Authorized by: Palin Carlin, MD   Critical care provider statement:    Critical care time (minutes):  30   Critical care time was exclusive of:  Separately billable procedures and treating other patients and teaching time   Critical care was necessary to treat or prevent imminent or life-threatening deterioration of the following conditions:  Respiratory  failure   Critical care was time spent personally by me on the following activities:  Development of treatment plan with patient or surrogate, discussions with consultants, evaluation of patient's response to treatment, examination of patient, ordering and review of laboratory studies, ordering and review of radiographic studies, ordering and performing treatments and interventions, pulse oximetry, re-evaluation of patient's condition and review of old charts   I assumed direction of critical care for this patient from another provider in my specialty: no     Care discussed with: admitting provider      MEDICATIONS ORDERED IN ED: Medications  ondansetron  (ZOFRAN ) injection 4 mg (4 mg Intravenous Given 12/03/23 2227)  methylPREDNISolone  sodium succinate (SOLU-MEDROL ) 125 mg/2 mL injection 125 mg (125 mg Intravenous Given 12/03/23 2234)  magnesium  sulfate IVPB 2 g 50 mL (2 g Intravenous New Bag/Given 12/03/23 2240)  ipratropium-albuterol  (DUONEB) 0.5-2.5 (3) MG/3ML nebulizer solution 3 mL (3 mLs Nebulization Given 12/03/23 2235)  LORazepam (ATIVAN) injection 1 mg (1 mg Intravenous Given 12/03/23 2250)  promethazine  (PHENERGAN ) 12.5 mg in sodium chloride  0.9 % 50 mL IVPB (12.5 mg Intravenous New Bag/Given 12/03/23 2336)     IMPRESSION / MDM / ASSESSMENT AND PLAN / ED COURSE  I reviewed the triage vital signs and the nursing notes.                              53 y.o. female with past medical history of COPD, chronic hypoxic respiratory failure on 2 L, and SLE who presents to the ED complaining of increasing difficulty breathing over the past 3 days.  Patient's presentation is most consistent with acute presentation with potential threat to life or bodily function.  Differential diagnosis includes, but is not limited to, ACS, PE, COPD exacerbation, CHF, pneumonia, anemia, electrolyte abnormality, AKI, anxiety.  Patient ill-appearing on arrival, tachycardic and tachypneic with increased work of breathing  and accessory muscle use.  She was transitioned to BiPAP, did require IV Ativan to manage anxiety while on BiPAP.  She has significant wheezing which we will treat with IV Solu-Medrol , IV magnesium , and additional DuoNeb.  VBG is reassuring, additional labs without significant anemia, low cytosis, electrolyte abnormality, or AKI.  Chest x-ray unremarkable, troponin pending at this time.  Patient turned over to oncoming provider pending troponin results and reassessment, suspect she will require admission.      FINAL CLINICAL IMPRESSION(S) / ED DIAGNOSES   Final diagnoses:  COPD exacerbation (HCC)  Respiratory distress     Rx / DC Orders   ED Discharge Orders     None        Note:  This document was prepared using  Dragon Chemical engineer and may include unintentional dictation errors.   Willo Dunnings, MD 12/04/23 [phone]

## 2023-12-03 NOTE — ED Triage Notes (Addendum)
 From Home called by Daughter, Hx of Asthma/ COPD, tired, shortness of breath with wheezing. Chronic 2.5 L, O2. Increasing shob over last 3 days. Having N/V, Exertion to the point of having to be carried to the EMS stretcher. EMS gave duoneb x 2. Noted tripod position, excessive accessory muscle use.

## 2023-12-04 ENCOUNTER — Encounter: Payer: Self-pay | Admitting: Family Medicine

## 2023-12-04 ENCOUNTER — Other Ambulatory Visit: Payer: Self-pay

## 2023-12-04 DIAGNOSIS — Z7951 Long term (current) use of inhaled steroids: Secondary | ICD-10-CM | POA: Diagnosis not present

## 2023-12-04 DIAGNOSIS — I10 Essential (primary) hypertension: Secondary | ICD-10-CM | POA: Diagnosis present

## 2023-12-04 DIAGNOSIS — K219 Gastro-esophageal reflux disease without esophagitis: Secondary | ICD-10-CM | POA: Diagnosis present

## 2023-12-04 DIAGNOSIS — Z1152 Encounter for screening for COVID-19: Secondary | ICD-10-CM | POA: Diagnosis not present

## 2023-12-04 DIAGNOSIS — J9621 Acute and chronic respiratory failure with hypoxia: Secondary | ICD-10-CM | POA: Diagnosis present

## 2023-12-04 DIAGNOSIS — E876 Hypokalemia: Secondary | ICD-10-CM | POA: Diagnosis present

## 2023-12-04 DIAGNOSIS — F17210 Nicotine dependence, cigarettes, uncomplicated: Secondary | ICD-10-CM | POA: Diagnosis present

## 2023-12-04 DIAGNOSIS — J441 Chronic obstructive pulmonary disease with (acute) exacerbation: Secondary | ICD-10-CM | POA: Diagnosis present

## 2023-12-04 DIAGNOSIS — Z9981 Dependence on supplemental oxygen: Secondary | ICD-10-CM | POA: Diagnosis not present

## 2023-12-04 DIAGNOSIS — G4700 Insomnia, unspecified: Secondary | ICD-10-CM | POA: Diagnosis present

## 2023-12-04 DIAGNOSIS — J9601 Acute respiratory failure with hypoxia: Secondary | ICD-10-CM | POA: Diagnosis present

## 2023-12-04 DIAGNOSIS — Z91010 Allergy to peanuts: Secondary | ICD-10-CM | POA: Diagnosis not present

## 2023-12-04 LAB — CBC
HCT: 39.9 % (ref 36.0–46.0)
Hemoglobin: 13.1 g/dL (ref 12.0–15.0)
MCH: 30.8 pg (ref 26.0–34.0)
MCHC: 32.8 g/dL (ref 30.0–36.0)
MCV: 93.9 fL (ref 80.0–100.0)
Platelets: 160 10*3/uL (ref 150–400)
RBC: 4.25 MIL/uL (ref 3.87–5.11)
RDW: 13 % (ref 11.5–15.5)
WBC: 6 10*3/uL (ref 4.0–10.5)
nRBC: 0 % (ref 0.0–0.2)

## 2023-12-04 LAB — BASIC METABOLIC PANEL WITH GFR
Anion gap: 10 (ref 5–15)
BUN: 10 mg/dL (ref 6–20)
CO2: 26 mmol/L (ref 22–32)
Calcium: 9.1 mg/dL (ref 8.9–10.3)
Chloride: 105 mmol/L (ref 98–111)
Creatinine, Ser: 0.76 mg/dL (ref 0.44–1.00)
GFR, Estimated: >60 mL/min (ref >60)
Glucose, Bld: 169 mg/dL — ABNORMAL HIGH (ref 70–99)
Potassium: 3.9 mmol/L (ref 3.5–5.1)
Sodium: 141 mmol/L (ref 135–145)

## 2023-12-04 LAB — BRAIN NATRIURETIC PEPTIDE: B Natriuretic Peptide: 62 pg/mL (ref 0.0–100.0)

## 2023-12-04 LAB — TROPONIN I (HIGH SENSITIVITY)
Troponin I (High Sensitivity): 6 ng/L (ref <18)
Troponin I (High Sensitivity): 6 ng/L (ref <18)

## 2023-12-04 LAB — HIV ANTIBODY (ROUTINE TESTING W REFLEX): HIV Screen 4th Generation wRfx: NONREACTIVE

## 2023-12-04 MED ORDER — TIOTROPIUM BROMIDE MONOHYDRATE 18 MCG IN CAPS: 18.0000 ug | ORAL_CAPSULE | Freq: Every day | RESPIRATORY_TRACT | Status: DC

## 2023-12-04 MED ORDER — ONDANSETRON HCL 4 MG PO TABS
4.0000 mg | ORAL_TABLET | Freq: Four times a day (QID) | ORAL | Status: DC | PRN
Start: 2023-12-04 — End: 2023-12-06
  Administered 2023-12-05 (×3): 4 mg via ORAL
  Filled 2023-12-04 (×3): qty 1

## 2023-12-04 MED ORDER — FLUTICASONE FUROATE-VILANTEROL 100-25 MCG/ACT IN AEPB
1.0000 | INHALATION_SPRAY | Freq: Every day | RESPIRATORY_TRACT | Status: DC
  Administered 2023-12-04 – 2023-12-06 (×3): 1 via RESPIRATORY_TRACT
  Filled 2023-12-04: qty 28

## 2023-12-04 MED ORDER — ACETAMINOPHEN 325 MG PO TABS: 650.0000 mg | ORAL_TABLET | Freq: Four times a day (QID) | ORAL | Status: DC | PRN

## 2023-12-04 MED ORDER — ENOXAPARIN SODIUM 40 MG/0.4ML IJ SOSY
40.0000 mg | PREFILLED_SYRINGE | INTRAMUSCULAR | Status: DC
  Administered 2023-12-04 – 2023-12-05 (×2): 40 mg via SUBCUTANEOUS
  Filled 2023-12-04 (×3): qty 0.4

## 2023-12-04 MED ORDER — SODIUM CHLORIDE 0.9 % IV SOLN: INTRAVENOUS | Status: AC

## 2023-12-04 MED ORDER — SODIUM CHLORIDE 0.9 % IV SOLN
1.0000 g | INTRAVENOUS | Status: DC
  Administered 2023-12-04 – 2023-12-06 (×3): 1 g via INTRAVENOUS
  Filled 2023-12-04 (×3): qty 10

## 2023-12-04 MED ORDER — PREDNISONE 20 MG PO TABS
40.0000 mg | ORAL_TABLET | Freq: Every day | ORAL | Status: DC
  Administered 2023-12-05 – 2023-12-06 (×2): 40 mg via ORAL
  Filled 2023-12-04 (×2): qty 2

## 2023-12-04 MED ORDER — PANTOPRAZOLE SODIUM 40 MG PO TBEC
40.0000 mg | DELAYED_RELEASE_TABLET | Freq: Every day | ORAL | Status: DC
  Administered 2023-12-04 – 2023-12-06 (×3): 40 mg via ORAL
  Filled 2023-12-04 (×3): qty 1

## 2023-12-04 MED ORDER — METHYLPREDNISOLONE SODIUM SUCC 40 MG IJ SOLR
40.0000 mg | Freq: Two times a day (BID) | INTRAMUSCULAR | Status: AC
  Administered 2023-12-04 (×2): 40 mg via INTRAVENOUS
  Filled 2023-12-04 (×2): qty 1

## 2023-12-04 MED ORDER — ALBUTEROL SULFATE (2.5 MG/3ML) 0.083% IN NEBU
2.5000 mg | INHALATION_SOLUTION | Freq: Four times a day (QID) | RESPIRATORY_TRACT | Status: DC
  Administered 2023-12-04 – 2023-12-05 (×6): 2.5 mg via RESPIRATORY_TRACT
  Filled 2023-12-04 (×6): qty 3

## 2023-12-04 MED ORDER — MAGNESIUM HYDROXIDE 400 MG/5ML PO SUSP
30.0000 mL | Freq: Every day | ORAL | Status: DC | PRN
  Administered 2023-12-06: 30 mL via ORAL
  Filled 2023-12-04: qty 30

## 2023-12-04 MED ORDER — MONTELUKAST SODIUM 10 MG PO TABS
10.0000 mg | ORAL_TABLET | Freq: Every day | ORAL | Status: DC
  Administered 2023-12-04 – 2023-12-05 (×2): 10 mg via ORAL
  Filled 2023-12-04 (×2): qty 1

## 2023-12-04 MED ORDER — ACETAMINOPHEN 650 MG RE SUPP: 650.0000 mg | Freq: Four times a day (QID) | RECTAL | Status: DC | PRN

## 2023-12-04 MED ORDER — TRAZODONE HCL 50 MG PO TABS: 25.0000 mg | ORAL_TABLET | Freq: Every evening | ORAL | Status: DC | PRN

## 2023-12-04 MED ORDER — OXYCODONE-ACETAMINOPHEN 5-325 MG PO TABS
1.0000 | ORAL_TABLET | Freq: Four times a day (QID) | ORAL | Status: DC | PRN
  Administered 2023-12-04 – 2023-12-06 (×8): 1 via ORAL
  Filled 2023-12-04 (×8): qty 1

## 2023-12-04 MED ORDER — BUDESONIDE 0.25 MG/2ML IN SUSP
0.2500 mg | Freq: Two times a day (BID) | RESPIRATORY_TRACT | Status: DC
  Administered 2023-12-04 – 2023-12-06 (×5): 0.25 mg via RESPIRATORY_TRACT
  Filled 2023-12-04 (×5): qty 2

## 2023-12-04 MED ORDER — UMECLIDINIUM BROMIDE 62.5 MCG/ACT IN AEPB
1.0000 | INHALATION_SPRAY | Freq: Every day | RESPIRATORY_TRACT | Status: DC
  Administered 2023-12-04 – 2023-12-06 (×3): 1 via RESPIRATORY_TRACT
  Filled 2023-12-04: qty 7

## 2023-12-04 MED ORDER — ONDANSETRON HCL 4 MG/2ML IJ SOLN
4.0000 mg | Freq: Four times a day (QID) | INTRAMUSCULAR | Status: DC | PRN
  Administered 2023-12-04 – 2023-12-06 (×3): 4 mg via INTRAVENOUS
  Filled 2023-12-04 (×3): qty 2

## 2023-12-04 NOTE — H&P (Signed)
 History and Physical    Patient: Grace King FMW:[id number] DOB: 08-21-1970 DOA: 12/03/2023 DOS: the patient was seen and examined on 12/04/2023 PCP: Nathen Dess, MD  Patient coming from: Home  Chief Complaint:  Chief Complaint  Patient presents with   Shortness of Breath   HPI: Grace King is a 53 y.o. female with medical history significant of COPD on oxygen  and erosive gastritis.  Presented to the ED via [place] with reports of shortness of breath.  Patient was on 70% Ventimask during nebulizer treatment.  Patient reported cannot have DuoNebs due to peanut allergy.  Baseline O2 requirements 2 L.  She was given an additional nebulizer treatment upon arrival to the ED as well as 2 g of IV magnesium .  She was also receiving prednisone  taper at home because of same symptoms.  Chest x-ray without evidence of pneumonia.  Review of Systems: As mentioned in the history of present illness. All other systems reviewed and are negative.   Past Medical History:  Diagnosis Date   Asthma    COPD (chronic obstructive pulmonary disease) (HCC)    Lupus    Past Surgical History:  Procedure Laterality Date   ABDOMINAL HYSTERECTOMY     BACK SURGERY     Social History:  reports that she has been smoking cigarettes. She has never used smokeless tobacco. She reports that she does not drink alcohol and does not use drugs.  Allergies  Allergen Reactions   Codeine  Itching    Pills   Cucumber Extract Anaphylaxis    Pickles    Diclofenac Potassium Hives   Duloxetine Rash   Hydrocodone-Acetaminophen  Anaphylaxis   Oxycodone  Itching   Pregabalin Hives and Rash   Shellfish Allergy Anaphylaxis   Erythromycin    Latex    Morphine Itching    Patient itched after administering 4 mg morphine   Other     pickles   Peanut-Containing Drug Products    Penicillins    Ketorolac Rash   Tramadol Rash    History reviewed. No pertinent family history.  Prior to Admission medications   Medication Sig Start  Date End Date Taking? Authorizing Provider  albuterol  (PROVENTIL ) (2.5 MG/3ML) 0.083% nebulizer solution Take 3 mLs (2.5 mg total) by nebulization every 4 (four) hours as needed for wheezing or shortness of breath. 10/28/23 10/27/24 Yes Willo Dunnings, MD  BREO ELLIPTA  100-25 MCG/ACT AEPB Inhale 1 puff into the lungs daily.   Yes [provider]  budesonide -formoterol  (SYMBICORT) 160-4.5 MCG/ACT inhaler Inhale 2 puffs into the lungs 2 (two) times daily.   Yes [provider]  Cholecalciferol 50 MCG (2000 UT) TABS Take 4,000 Units by mouth daily. 01/25/22  Yes [provider]  cyanocobalamin  (VITAMIN B12) 1000 MCG/ML injection Inject 1,000 mcg into the muscle once a week. 08/09/22  Yes [provider]  EPINEPHrine  0.3 mg/0.3 mL IJ SOAJ injection Inject 0.3 mLs (0.3 mg total) into the muscle as needed for anaphylaxis. 11/14/19  Yes Edelmiro, [place], MD  guaiFENesin  (MUCINEX ) 600 MG 12 hr tablet Take 1 tablet (600 mg total) by mouth 2 (two) times daily as needed for cough or to loosen phlegm. 07/11/21  Yes Samtani, Jai-Gurmukh, MD  INCRUSE ELLIPTA 62.5 MCG/ACT AEPB Inhale 1 puff into the lungs daily.   Yes [provider]  montelukast  (SINGULAIR ) 10 MG tablet Take 10 mg by mouth at bedtime.    Yes [provider]  oxyCODONE -acetaminophen  (PERCOCET/ROXICET) 5-325 MG tablet Take 1 tablet by mouth every 6 (six) hours as  needed for severe pain.   Yes [provider]  pantoprazole  (PROTONIX ) 40 MG tablet Take 40 mg by mouth 2 (two) times daily.   Yes [provider]  SUMAtriptan  (IMITREX ) 50 MG tablet Take 50 mg by mouth daily as needed for migraine. (May repeat after 2 hours if needed)   Yes [provider]  tiotropium (SPIRIVA  HANDIHALER) 18 MCG inhalation capsule Place 18 mcg into inhaler and inhale daily.   Yes [provider]  tiZANidine (ZANAFLEX) 4 MG tablet Take 4 mg by mouth at bedtime as needed. 08/09/23  Yes [provider]  traZODone  (DESYREL ) 100 MG tablet Take 100 mg by mouth at bedtime.   Yes [provider]  amLODipine  (NORVASC ) 5 MG tablet Take 1 tablet (5 mg total) by mouth daily. Patient not taking: Reported on 12/04/2023 07/12/21   Samtani, Jai-Gurmukh, MD  dextromethorphan  (DELSYM ) 30 MG/5ML liquid Take 2.5 mLs (15 mg total) by mouth 2 (two) times daily. Patient not taking: Reported on 12/04/2023 07/11/21   Royal Sill, MD    Physical Exam: Vitals:   12/04/23 0634 12/04/23 0700 12/04/23 0928 12/04/23 0933  BP: 129/77 133/72 125/88   Pulse: 89 81 (!) 102   Resp: (!) 22 20 16    Temp:   98 F (36.7 C) 97.6 F (36.4 C)  TempSrc:   Oral   SpO2: 99% 100% 99%   Weight:       Constitutional: NAD, calm, comfortable Respiratory: clear to auscultation bilaterally, no wheezing, no crackles. Normal respiratory effort.  Good air movement throughout posterior lung fields, no accessory muscle use. 2L Cardiovascular: Regular rate and rhythm, no murmurs / rubs / gallops. No extremity edema. 2+ pedal pulses.  Abdomen: no tenderness, no masses palpated. No hepatosplenomegaly. Bowel sounds positive.  Musculoskeletal: no clubbing / cyanosis. No joint deformity upper and lower extremities. Good ROM, no contractures. Normal muscle tone.  Skin: no rashes, lesions, ulcers. No induration Neurologic: CN 2-12 grossly intact. Sensation intact, Strength 5/5 x all 4 extremities.  Psychiatric: Normal judgment and insight. Alert and oriented x 3. Normal mood.    Data Reviewed:  Sodium 141, initial potassium 3.2 but has subsequently increased to 3.9 without treatment, CO2 26, glucose 169, BUN 10, creatinine 0.76  BNP 62, troponin normal x 2 collections  WBC 6000 differential not obtained, hemoglobin 13.1, platelets 160,000  Chest x-ray stable without acute or active cardiopulmonary disease  Assessment and Plan: Acute on chronic hypoxemic respiratory failure secondary to COPD exacerbation O2 has  been weaned to baseline of 2 L/min Patient reports baseline airway movement and no wheezing noted on auscultation Initially will receive 40 mg IV prednisone  for 24 hours twice daily then transition to prednisone  40 mg daily We utilized a combination of home medications of Breo Ellipta , Incruse Ellipta and Singulair  Have started albuterol  nebs scheduled every 6 hours along with budesonide  nebs every 12 hours If patient can tolerate ambulation with baseline level of O2 without worsening of symptoms can likely discharge home on 7/3 Patient reports frequent COPD exacerbations about every 2 months Initial ABG PO2 in the 40s/pCO2 in the 40s At risk for secondary bacterial bronchitis so have continued Rocephin   Mild hypokalemia Likely secondary to multiple albuterol  nebulizer treatment Has increased from 3.2-3.9  GERD/history of erosive gastritis PPI  Insomnia Trazodone  ordered as needed   Advance Care Planning:   Code Status: Full Code   VTE prophylaxis: Lovenox   Consults: None  Family Communication: Patient only  Severity of Illness:  The appropriate patient status for this patient is INPATIENT. Inpatient status is judged to be reasonable and necessary in order to provide the required intensity of service to ensure the patient's safety. The patient's presenting symptoms, physical exam findings, and initial radiographic and laboratory data in the context of their chronic comorbidities is felt to place them at high risk for further clinical deterioration. Furthermore, it is not anticipated that the patient will be medically stable for discharge from the hospital within 2 midnights of admission.   * I certify that at the point of admission it is my clinical judgment that the patient will require inpatient hospital care spanning beyond 2 midnights from the point of admission due to high intensity of service, high risk for further deterioration and high frequency of surveillance  required.*  Author: Isaiah Lever, NP 12/04/2023 10:37 AM  For on call review [email or web address].

## 2023-12-04 NOTE — ED Notes (Signed)
 Pt requesting IV to be changed due to burning , IV flushed without difficulty. This nurse attempted to obtain other IV access without success, per pt lets wait to try again until closer to when the meds are due later and leave this one in for now. Continuous fluids paused at this time.

## 2023-12-04 NOTE — ED Notes (Signed)
 Patient requesting to be taken off BiPAP at this time. This RN called RT and was instructed to put the Bipap on standby and put her back on 2 L [place]. Patient now has regular and unlabored respirations.

## 2023-12-04 NOTE — ED Notes (Signed)
 Patient called out informing this RN that she was concerned about not getting her home medications, specifically her home inhalers. This RN hollace Peaches, MD informing him of this at this time. Per MD, he will come and assess patient around 0700 and put in the necessary home meds if needed. Will continue to monitor.

## 2023-12-04 NOTE — ED Notes (Signed)
 Patient asked this RN for her home Percocet 5/325mg . No current order for percocet in place, so this RN hollace Peaches, MD at this time. Mansy, MD gave a verbal order for 1 tablet of Percocet (oxycodone /acetaminophen ) 5/325mg  q6hr as needed for severe pain. Will continue to monitor.

## 2023-12-05 MED ORDER — ALBUTEROL SULFATE (2.5 MG/3ML) 0.083% IN NEBU: 2.5000 mg | INHALATION_SOLUTION | RESPIRATORY_TRACT | Status: DC | PRN

## 2023-12-05 MED ORDER — ALBUTEROL SULFATE (2.5 MG/3ML) 0.083% IN NEBU
2.5000 mg | INHALATION_SOLUTION | Freq: Three times a day (TID) | RESPIRATORY_TRACT | Status: DC
  Administered 2023-12-05 – 2023-12-06 (×2): 2.5 mg via RESPIRATORY_TRACT
  Filled 2023-12-05 (×2): qty 3

## 2023-12-05 NOTE — Plan of Care (Signed)
  Problem: Education: Goal: Knowledge of General Education information will improve Description: Including pain rating scale, medication(s)/side effects and non-pharmacologic comfort measures Outcome: Progressing   Problem: Clinical Measurements: Goal: Ability to maintain clinical measurements within normal limits will improve Outcome: Progressing   Problem: Clinical Measurements: Goal: Will remain free from infection Outcome: Progressing   Problem: Clinical Measurements: Goal: Diagnostic test results will improve Outcome: Progressing   Problem: Clinical Measurements: Goal: Respiratory complications will improve Outcome: Progressing   Problem: Safety: Goal: Ability to remain free from injury will improve Outcome: Progressing   Problem: Respiratory: Goal: Ability to maintain a clear airway will improve Outcome: Progressing   Problem: Respiratory: Goal: Levels of oxygenation will improve Outcome: Progressing   Problem: Respiratory: Goal: Ability to maintain adequate ventilation will improve Outcome: Progressing    Plan of care, assessment, treatment, monitoring, and intervention (s)   ongoing, see MAR see flowsheet

## 2023-12-05 NOTE — Progress Notes (Signed)
 Progress Note   Patient: Grace King FMW:[id number] DOB: [birth year] DOA: 12/03/2023     1 DOS: the patient was seen and examined on 12/05/2023   Brief hospital course: Grace King is a 53 y.o. female with medical history significant of COPD on oxygen  and erosive gastritis.  Presented to the ED via MS with reports of shortness of breath.  Patient was on 70% Ventimask during nebulizer treatment.  Patient reported cannot have DuoNebs due to peanut allergy.  Baseline O2 requirements 2 L.  She was given an additional nebulizer treatment upon arrival to the ED as well as 2 g of IV magnesium .  She was also receiving prednisone  taper at home because of same symptoms.  Chest x-ray without evidence of pneumonia.   Assessment and Plan: Acute on chronic hypoxemic respiratory failure secondary to COPD exacerbation Continue oxygen  requirement Continue to complete 5 days course of prednisone  Continue as needed nebulization  Mild hypokalemia Likely secondary to multiple albuterol  nebulizer treatment Has increased from 3.2-3.9   GERD/history of erosive gastritis PPI   Insomnia Trazodone  ordered as needed     Advance Care Planning:   Code Status: Full Code    VTE prophylaxis: Lovenox    Consults: None   Family Communication: Patient only    Subjective:  Seen and examined at bedside this morning Have been transition off BiPAP Respiratory function improving Currently on 3 L of intranasal oxygen   Physical Exam:  Constitutional: NAD, calm, comfortable Respiratory: clear to auscultation bilaterally, no wheezing, no crackles. Normal respiratory effort. Cardiovascular: Regular rate and rhythm, no murmurs / rubs / gallops. No extremity edema. 2+ pedal pulses.  Abdomen: no tenderness, no masses palpated. No hepatosplenomegaly. Bowel sounds positive.  Musculoskeletal: no clubbing / cyanosis. No joint deformity upper and lower extremities. Good ROM, no contractures. Normal muscle tone.  Skin: no  rashes, lesions, ulcers. No induration Neurologic: CN 2-12 grossly intact. Sensation intact, Strength 5/5 x all 4 extremities.  Psychiatric: Normal judgment and insight. Alert and oriented x 3. Normal mood.    Vitals:   12/05/23 0238 12/05/23 0739 12/05/23 1229 12/05/23 1627  BP:  (!) 143/88 (!) 140/70 (!) 150/80  Pulse:  85 86 85  Resp:      Temp:   98.5 F (36.9 C) 98.1 F (36.7 C)  TempSrc:      SpO2: 98% 99% 98% 98%  Weight:      Height:        Data Reviewed: I have reviewed patient chest x-ray that did not show any infiltrate  {    Latest Ref Rng & Units 12/04/2023    5:00 AM 12/03/2023   10:22 PM 10/28/2023   10:55 AM  CBC  WBC 4.0 - 10.5 K/uL 6.0  7.3  5.9   Hemoglobin 12.0 - 15.0 g/dL 86.8  85.7  86.4   Hematocrit 36.0 - 46.0 % 39.9  43.3  41.7   Platelets 150 - 400 K/uL 160  179  172        Latest Ref Rng & Units 12/04/2023    5:00 AM 12/03/2023   10:22 PM 10/28/2023   10:55 AM  BMP  Glucose 70 - 99 mg/dL 830  897  878   BUN 6 - 20 mg/dL 10  8  12    Creatinine 0.44 - 1.00 mg/dL 9.23  9.33  9.32   Sodium 135 - 145 mmol/L 141  136  142   Potassium 3.5 - 5.1 mmol/L 3.9  3.2  4.0  Chloride 98 - 111 mmol/L 105  99  103   CO2 22 - 32 mmol/L 26  25  31    Calcium 8.9 - 10.3 mg/dL 9.1  9.4  8.7        Author: Drue ONEIDA Potter, MD 12/05/2023 4:42 PM  For on call review [email or web address].

## 2023-12-05 NOTE — Progress Notes (Signed)
 Transition of Care [facility]) - Inpatient Brief Assessment   Patient Details  Name: Grace King MRN: [id number] Date of Birth: Dec 02, 1970  Transition of [facility]) CM/SW Contact:    Kapri Nero C Leasha Goldberger, RN Phone Number: 12/05/2023, 3:30 PM   Clinical Narrative: TOC continuing to follow patient's progress throughout discharge planning.   Transition of Care Asessment: Insurance and Status: Insurance coverage has been reviewed Patient has primary care physician: Yes   Prior level of function:: Independent Prior/Current Home Services: No current home services Social Drivers of Health Review: SDOH reviewed no interventions necessary Readmission risk has been reviewed: Yes Transition of care needs: no transition of care needs at this time

## 2023-12-05 NOTE — Care Management Important Message (Signed)
 Important Message  Patient Details  Name: Grace King MRN: [id number] Date of Birth: 25-Jan-1971   Important Message Given:  Yes - Medicare IM     Grace King 12/05/2023, 12:48 PM

## 2023-12-06 LAB — BASIC METABOLIC PANEL WITH GFR
Anion gap: 12 (ref 5–15)
BUN: 18 mg/dL (ref 6–20)
CO2: 25 mmol/L (ref 22–32)
Calcium: 9 mg/dL (ref 8.9–10.3)
Chloride: 103 mmol/L (ref 98–111)
Creatinine, Ser: 0.52 mg/dL (ref 0.44–1.00)
GFR, Estimated: >60 mL/min (ref >60)
Glucose, Bld: 130 mg/dL — ABNORMAL HIGH (ref 70–99)
Potassium: 3.9 mmol/L (ref 3.5–5.1)
Sodium: 140 mmol/L (ref 135–145)

## 2023-12-06 LAB — CBC WITH DIFFERENTIAL/PLATELET
Abs Immature Granulocytes: 0.03 K/uL (ref 0.00–0.07)
Basophils Absolute: 0 K/uL (ref 0.0–0.1)
Basophils Relative: 0 %
Eosinophils Absolute: 0 K/uL (ref 0.0–0.5)
Eosinophils Relative: 0 %
HCT: 35.8 % — ABNORMAL LOW (ref 36.0–46.0)
Hemoglobin: 11.8 g/dL — ABNORMAL LOW (ref 12.0–15.0)
Immature Granulocytes: 0 %
Lymphocytes Relative: 23 %
Lymphs Abs: 1.8 K/uL (ref 0.7–4.0)
MCH: 31.1 pg (ref 26.0–34.0)
MCHC: 33 g/dL (ref 30.0–36.0)
MCV: 94.5 fL (ref 80.0–100.0)
Monocytes Absolute: 0.5 K/uL (ref 0.1–1.0)
Monocytes Relative: 7 %
Neutro Abs: 5.4 K/uL (ref 1.7–7.7)
Neutrophils Relative %: 70 %
Platelets: 128 K/uL — ABNORMAL LOW (ref 150–400)
RBC: 3.79 MIL/uL — ABNORMAL LOW (ref 3.87–5.11)
RDW: 13.6 % (ref 11.5–15.5)
WBC: 7.7 K/uL (ref 4.0–10.5)
nRBC: 0 % (ref 0.0–0.2)

## 2023-12-06 MED ORDER — PREDNISONE 20 MG PO TABS: 40.0000 mg | ORAL_TABLET | Freq: Every day | ORAL | 0 refills | Status: AC

## 2023-12-06 NOTE — Discharge Summary (Signed)
 Physician Discharge Summary   Patient: Grace King MRN: [id number] DOB: 1970-12-05  Admit date:     12/03/2023  Discharge date: 12/06/23  Discharge Physician: Drue ONEIDA Potter   PCP: Nathen Dess, MD   Recommendations at discharge:  Follow-up with PCP  Discharge Diagnoses: Acute on chronic hypoxemic respiratory failure secondary to COPD exacerbation Mild hypokalemia GERD/history of erosive gastritis Insomnia  Hospital Course: [name] is a 53 y.o. female with medical history significant of COPD on oxygen  and erosive gastritis.  Presented to the ED via [place] with reports of shortness of breath.  Patient was on 70% Ventimask during nebulizer treatment.  Patient reported cannot have DuoNebs due to peanut allergy.  Baseline O2 requirements 2 L.  She was given an additional nebulizer treatment upon arrival to the ED as well as 2 g of IV magnesium .  She was also receiving prednisone  taper at home because of same symptoms.  Chest x-ray without evidence of pneumonia.  Patient respiratory function improved with nebulization.patient has been weaned down to have baseline oxygen  level.  She is therefore being discharged today to follow-up with PCP  Consultants: None Procedures performed: None Disposition: Home Diet recommendation:  Cardiac diet DISCHARGE MEDICATION: Allergies as of 12/06/2023       Reactions   Codeine  Itching   Pills   Cucumber Extract Anaphylaxis   Pickles    Diclofenac Potassium Hives   Duloxetine Rash   Hydrocodone-acetaminophen  Anaphylaxis   Oxycodone  Itching   Pregabalin Hives, Rash   Shellfish Allergy Anaphylaxis   Erythromycin    Latex    Morphine Itching   Patient itched after administering 4 mg morphine   Other    pickles   Peanut-containing Drug Products    Penicillins    Ketorolac Rash   Tramadol Rash        Medication List     TAKE these medications    albuterol  (2.5 MG/3ML) 0.083% nebulizer solution Commonly known as: PROVENTIL  Take 3 mLs  (2.5 mg total) by nebulization every 4 (four) hours as needed for wheezing or shortness of breath.   amLODipine  5 MG tablet Commonly known as: NORVASC  Take 1 tablet (5 mg total) by mouth daily.   Breo Ellipta  100-25 MCG/ACT Aepb Generic drug: fluticasone  furoate-vilanterol Inhale 1 puff into the lungs daily.   budesonide -formoterol  160-4.5 MCG/ACT inhaler Commonly known as: SYMBICORT Inhale 2 puffs into the lungs 2 (two) times daily.   Cholecalciferol 50 MCG (2000 UT) Tabs Take 4,000 Units by mouth daily.   cyanocobalamin  1000 MCG/ML injection Commonly known as: VITAMIN B12 Inject 1,000 mcg into the muscle once a week.   dextromethorphan  30 MG/5ML liquid Commonly known as: DELSYM  Take 2.5 mLs (15 mg total) by mouth 2 (two) times daily.   EPINEPHrine  0.3 mg/0.3 mL Soaj injection Commonly known as: EPI-PEN Inject 0.3 mLs (0.3 mg total) into the muscle as needed for anaphylaxis.   guaiFENesin  600 MG 12 hr tablet Commonly known as: MUCINEX  Take 1 tablet (600 mg total) by mouth 2 (two) times daily as needed for cough or to loosen phlegm.   Incruse Ellipta  62.5 MCG/ACT Aepb Generic drug: umeclidinium bromide  Inhale 1 puff into the lungs daily.   montelukast  10 MG tablet Commonly known as: SINGULAIR  Take 10 mg by mouth at bedtime.   oxyCODONE -acetaminophen  5-325 MG tablet Commonly known as: PERCOCET/ROXICET Take 1 tablet by mouth every 6 (six) hours as needed for severe pain.   pantoprazole  40 MG tablet Commonly known as: PROTONIX  Take 40 mg by  mouth 2 (two) times daily.   predniSONE  20 MG tablet Commonly known as: DELTASONE  Take 2 tablets (40 mg total) by mouth daily with breakfast for 3 days. Start taking on: December 07, 2023   Spiriva  HandiHaler 18 MCG inhalation capsule Generic drug: tiotropium Place 18 mcg into inhaler and inhale daily.   SUMAtriptan  50 MG tablet Commonly known as: IMITREX  Take 50 mg by mouth daily as needed for migraine. (May repeat after 2  hours if needed)   tiZANidine 4 MG tablet Commonly known as: ZANAFLEX Take 4 mg by mouth at bedtime as needed.   traZODone  100 MG tablet Commonly known as: DESYREL  Take 100 mg by mouth at bedtime.        Discharge Exam: Filed Weights   12/04/23 0152 12/04/23 2100  Weight: 61 kg 64.2 kg   Constitutional: NAD, calm, comfortable Respiratory: clear to auscultation bilaterally Cardiovascular: Regular rate and rhythm, no murmurs / rubs / gallops. No extremity edema. 2+ pedal pulses.  Abdomen: no tenderness, no masses palpated. No hepatosplenomegaly. Bowel sounds positive.  Musculoskeletal: no clubbing / cyanosis. No joint deformity upper and lower extremities. Good ROM, no contractures. Normal muscle tone.  Skin: no rashes, lesions, ulcers. No induration Neurologic: CN 2-12 grossly intact. Sensation intact, Strength 5/5 x all 4 extremities.  Psychiatric: Normal judgment and insight. Alert and oriented x 3. Normal mood.      Condition at discharge: good  The results of significant diagnostics from this hospitalization (including imaging, microbiology, ancillary and laboratory) are listed below for reference.   Imaging Studies: DG Chest Portable 1 View Result Date: 12/03/2023 CLINICAL DATA:  Shortness of breath and wheezing. EXAM: PORTABLE CHEST 1 VIEW COMPARISON:  Oct 28, 2023 FINDINGS: The heart size and mediastinal contours are within normal limits. The lungs are hyperinflated. Stable bullous changes are seen along the periphery of the right upper lobe. Of the there is no evidence of acute infiltrate, pleural effusion or pneumothorax. The visualized skeletal structures are unremarkable. IMPRESSION: Stable exam without active cardiopulmonary disease. Electronically Signed   By: Suzen Dials M.D.   On: 12/03/2023 23:51    Microbiology: Results for orders placed or performed during the hospital encounter of 09/08/22  Blood Culture (routine x 2)     Status: None   Collection Time:  09/08/22  6:34 PM   Specimen: BLOOD  Result Value Ref Range Status   Specimen Description BLOOD BLOOD LEFT FOREARM  Final   Special Requests   Final    BOTTLES DRAWN AEROBIC AND ANAEROBIC Blood Culture adequate volume   Culture   Final    NO GROWTH 5 DAYS Performed at [facility], [address], [place], [place] [address]    Report Status 09/13/2022 FINAL  Final  Blood Culture (routine x 2)     Status: None   Collection Time: 09/08/22  6:34 PM   Specimen: BLOOD  Result Value Ref Range Status   Specimen Description BLOOD BLOOD RIGHT FOREARM  Final   Special Requests   Final    BOTTLES DRAWN AEROBIC AND ANAEROBIC Blood Culture adequate volume   Culture   Final    NO GROWTH 5 DAYS Performed at [facility], [address], [place], [place] [address]    Report Status 09/13/2022 FINAL  Final  Resp panel by RT-PCR (RSV, Flu A&B, Covid) Anterior Nasal Swab     Status: None   Collection Time: 09/08/22  6:34 PM   Specimen: Anterior Nasal Swab  Result Value  Ref Range Status   SARS Coronavirus 2 by RT PCR NEGATIVE NEGATIVE Final    Comment: (NOTE) SARS-CoV-2 target nucleic acids are NOT DETECTED.  The SARS-CoV-2 RNA is generally detectable in upper respiratory specimens during the acute phase of infection. The lowest concentration of SARS-CoV-2 viral copies this assay can detect is 138 copies/mL. A negative result does not preclude SARS-Cov-2 infection and should not be used as the sole basis for treatment or other patient management decisions. A negative result may occur with  improper specimen collection/handling, submission of specimen other than nasopharyngeal swab, presence of viral mutation(s) within the areas targeted by this assay, and inadequate number of viral copies(<138 copies/mL). A negative result must be combined with clinical observations, patient history, and epidemiological information. The expected result is Negative.  Fact Sheet for Patients:   BloggerCourse.com  Fact Sheet for Healthcare Providers:  SeriousBroker.it  This test is no t yet approved or cleared by the [place]  FDA and  has been authorized for detection and/or diagnosis of SARS-CoV-2 by FDA under an Emergency Use Authorization (EUA). This EUA will remain  in effect (meaning this test can be used) for the duration of the COVID-19 declaration under Section 564(b)(1) of the Act, 21 U.S.C.section 360bbb-3(b)(1), unless the authorization is terminated  or revoked sooner.       Influenza A by PCR NEGATIVE NEGATIVE Final   Influenza B by PCR NEGATIVE NEGATIVE Final    Comment: (NOTE) The Xpert Xpress SARS-CoV-2/FLU/RSV plus assay is intended as an aid in the diagnosis of influenza from Nasopharyngeal swab specimens and should not be used as a sole basis for treatment. Nasal washings and aspirates are unacceptable for Xpert Xpress SARS-CoV-2/FLU/RSV testing.  Fact Sheet for Patients: BloggerCourse.com  Fact Sheet for Healthcare Providers: SeriousBroker.it  This test is not yet approved or cleared by the [place]  FDA and has been authorized for detection and/or diagnosis of SARS-CoV-2 by FDA under an Emergency Use Authorization (EUA). This EUA will remain in effect (meaning this test can be used) for the duration of the COVID-19 declaration under Section 564(b)(1) of the Act, 21 U.S.C. section 360bbb-3(b)(1), unless the authorization is terminated or revoked.     Resp Syncytial Virus by PCR NEGATIVE NEGATIVE Final    Comment: (NOTE) Fact Sheet for Patients: BloggerCourse.com  Fact Sheet for Healthcare Providers: SeriousBroker.it  This test is not yet approved or cleared by the [place]  FDA and has been authorized for detection and/or diagnosis of SARS-CoV-2 by FDA under an Emergency Use  Authorization (EUA). This EUA will remain in effect (meaning this test can be used) for the duration of the COVID-19 declaration under Section 564(b)(1) of the Act, 21 U.S.C. section 360bbb-3(b)(1), unless the authorization is terminated or revoked.  Performed at [facility], [address] [place], [place] [address]     Labs: CBC: Recent Labs  Lab 12/03/23 2222 12/04/23 0500 12/06/23 0323  WBC 7.3 6.0 7.7  NEUTROABS 4.4  --  5.4  HGB 14.2 13.1 11.8*  HCT 43.3 39.9 35.8*  MCV 92.1 93.9 94.5  PLT 179 160 128*   Basic Metabolic Panel: Recent Labs  Lab 12/03/23 2222 12/04/23 0500 12/06/23 0323  NA 136 141 140  K 3.2* 3.9 3.9  CL 99 105 103  CO2 25 26 25   GLUCOSE 102* 169* 130*  BUN 8 10 18   CREATININE 0.66 0.76 0.52  CALCIUM 9.4 9.1 9.0   Liver Function Tests: Recent Labs  Lab 12/03/23 2222  AST  18  ALT 17  ALKPHOS 69  BILITOT 0.9  PROT 6.8  ALBUMIN 4.2   CBG: No results for input(s): GLUCAP in the last 168 hours.  Discharge time spent:  .  Signed: Drue ONEIDA Potter, MD [facility] 12/06/2023

## 2024-06-08 ENCOUNTER — Inpatient Hospital Stay
Admission: EM | Admit: 2024-06-08 | Discharge: 2024-06-12 | DRG: 189 | Disposition: A | Discharge status: Home / Self Care | Attending: Family Medicine | Admitting: Family Medicine

## 2024-06-08 ENCOUNTER — Emergency Department

## 2024-06-08 DIAGNOSIS — Z9981 Dependence on supplemental oxygen: Secondary | ICD-10-CM

## 2024-06-08 DIAGNOSIS — J9621 Acute and chronic respiratory failure with hypoxia: Principal | ICD-10-CM | POA: Diagnosis present

## 2024-06-08 DIAGNOSIS — Z885 Allergy status to narcotic agent status: Secondary | ICD-10-CM

## 2024-06-08 DIAGNOSIS — Z90710 Acquired absence of both cervix and uterus: Secondary | ICD-10-CM

## 2024-06-08 DIAGNOSIS — Z881 Allergy status to other antibiotic agents status: Secondary | ICD-10-CM

## 2024-06-08 DIAGNOSIS — Z7951 Long term (current) use of inhaled steroids: Secondary | ICD-10-CM

## 2024-06-08 DIAGNOSIS — Z888 Allergy status to other drugs, medicaments and biological substances status: Secondary | ICD-10-CM

## 2024-06-08 DIAGNOSIS — Z880 Allergy status to penicillin: Secondary | ICD-10-CM

## 2024-06-08 DIAGNOSIS — Z1152 Encounter for screening for COVID-19: Secondary | ICD-10-CM

## 2024-06-08 DIAGNOSIS — J441 Chronic obstructive pulmonary disease with (acute) exacerbation: Principal | ICD-10-CM | POA: Diagnosis present

## 2024-06-08 DIAGNOSIS — J984 Other disorders of lung: Secondary | ICD-10-CM | POA: Diagnosis present

## 2024-06-08 DIAGNOSIS — F17210 Nicotine dependence, cigarettes, uncomplicated: Secondary | ICD-10-CM | POA: Diagnosis present

## 2024-06-08 DIAGNOSIS — R0902 Hypoxemia: Secondary | ICD-10-CM

## 2024-06-08 DIAGNOSIS — Z91040 Latex allergy status: Secondary | ICD-10-CM

## 2024-06-08 DIAGNOSIS — Z23 Encounter for immunization: Secondary | ICD-10-CM

## 2024-06-08 DIAGNOSIS — Z79899 Other long term (current) drug therapy: Secondary | ICD-10-CM

## 2024-06-08 LAB — CBC
HCT: 43.1 % (ref 36.0–46.0)
Hemoglobin: 14 g/dL (ref 12.0–15.0)
MCH: 30.6 pg (ref 26.0–34.0)
MCHC: 32.5 g/dL (ref 30.0–36.0)
MCV: 94.1 fL (ref 80.0–100.0)
Platelets: 179 K/uL (ref 150–400)
RBC: 4.58 MIL/uL (ref 3.87–5.11)
RDW: 12.8 % (ref 11.5–15.5)
WBC: 10.5 K/uL (ref 4.0–10.5)
nRBC: 0 % (ref 0.0–0.2)

## 2024-06-08 LAB — LACTIC ACID, PLASMA: Lactic Acid, Venous: 0.8 mmol/L (ref 0.5–1.9)

## 2024-06-08 MED ORDER — METHYLPREDNISOLONE SODIUM SUCC 125 MG IJ SOLR
125.0000 mg | Freq: Once | INTRAMUSCULAR | Status: AC
  Administered 2024-06-09: 125 mg via INTRAVENOUS
  Filled 2024-06-08: qty 2

## 2024-06-08 MED ORDER — LORAZEPAM 2 MG/ML IJ SOLN
1.0000 mg | Freq: Once | INTRAMUSCULAR | Status: AC
  Administered 2024-06-09: 1 mg via INTRAVENOUS
  Filled 2024-06-08: qty 1

## 2024-06-08 MED ORDER — IPRATROPIUM-ALBUTEROL 0.5-2.5 (3) MG/3ML IN SOLN
9.0000 mL | Freq: Once | RESPIRATORY_TRACT | Status: AC
  Administered 2024-06-09: 9 mL via RESPIRATORY_TRACT
  Filled 2024-06-08: qty 9

## 2024-06-08 NOTE — ED Triage Notes (Signed)
 Pt to ED via [facility] from home c/o respiratory distress. Pt has been without her neb tx for the last 4 days. Pt reports increased shob and back pain. Pt was 74% 4L. Pt wears 4L at BL. Pt given albuterol  tx by EMS. O2 sat currently 100% on Bipap.  120 HR 145/90  74% 4L

## 2024-06-08 NOTE — ED Provider Notes (Signed)
 "  [facility] Provider Note    Event Date/Time   First MD Initiated Contact with Patient 06/08/24 2233     (approximate)   History   Respiratory Distress   HPI  Grace King is a 54 y.o. female who presents to the ED for evaluation of Respiratory Distress   I reviewed medical DC summary from July, history of COPD on chronic 2 L  Patient presents to the ED with shortness of breath.  History is somewhat limited as she arrives in respiratory distress, quickly placed on BiPAP with improvement of her clinical picture.   Physical Exam   Triage Vital Signs: ED Triage Vitals  Encounter Vitals Group     BP 06/08/24 2232 (!) 154/98     Girls Systolic BP Percentile --      Girls Diastolic BP Percentile --      Boys Systolic BP Percentile --      Boys Diastolic BP Percentile --      Pulse Rate 06/08/24 2232 (!) 118     Resp 06/08/24 2232 (!) 29     Temp 06/08/24 2232 98.4 F (36.9 C)     Temp Source 06/08/24 2232 Oral     SpO2 06/08/24 2223 100 %     Weight 06/08/24 2227 144 lb 13.5 oz (65.7 kg)     Height 06/08/24 2227 5' 7 (1.702 m)     Head Circumference --      Peak Flow --      Pain Score 06/08/24 2226 10     Pain Loc --      Pain Education --      Exclude from Growth Chart --     Most recent vital signs: Vitals:   06/08/24 2223 06/08/24 2232  BP:  (!) 154/98  Pulse:  (!) 118  Resp:  (!) 29  Temp:  98.4 F (36.9 C)  SpO2: 100% 100%    General: Awake. Sitting upright, tripoding .  Improving on BiPAP CV:  Good peripheral perfusion.  Resp:  Tachypneic to about 30, very poor airflow and tight with minimal breath sounds Abd:  No distention.  MSK:  No deformity noted.  Neuro:  No focal deficits appreciated. Other:     ED Results / Procedures / Treatments   Labs (all labs ordered are listed, but only abnormal results are displayed) Labs Reviewed  CULTURE, BLOOD (ROUTINE X 2)  CULTURE, BLOOD (ROUTINE X 2)  RESP PANEL BY RT-PCR  (RSV, FLU A&B, COVID)  [id number]  CBC  BASIC METABOLIC PANEL WITH GFR  LACTIC ACID, PLASMA  BLOOD GAS, VENOUS  PROCALCITONIN  MAGNESIUM   TROPONIN T, HIGH SENSITIVITY    EKG Sinus tachycardia with rate of 118 bpm, wandering baseline, no STEMI, signs of LVH  RADIOLOGY 1 view CXR interpreted by me with hyperinflation without infiltrate or pneumothorax  Official radiology report(s): DG Chest Portable 1 View Result Date: 06/08/2024 EXAM: 1 VIEW(S) XRAY OF THE CHEST 06/08/2024 10:49:00 PM COMPARISON: 12/03/2023 CLINICAL HISTORY: FINDINGS: LUNGS AND PLEURA: Hyperinflated lungs. Emphysematous changes, most pronounced in upper lobes. Lung base opacities are consistent with overlying soft tissue attenuation. No pleural effusion. No pneumothorax. HEART AND MEDIASTINUM: No acute abnormality of the cardiac and mediastinal silhouettes. BONES AND SOFT TISSUES: No acute osseous abnormality. IMPRESSION: 1. Hyperinflated lungs with emphysematous changes, most pronounced in the upper lobes. Electronically signed by: Elsie Gravely MD 06/08/2024 11:00 PM EST RP Workstation: [id number]    PROCEDURES and INTERVENTIONS:  .Critical Care  Performed by: Claudene Rover, MD Authorized by: Claudene Rover, MD   Critical care provider statement:    Critical care time (minutes):  30   Critical care time was exclusive of:  Separately billable procedures and treating other patients   Critical care was necessary to treat or prevent imminent or life-threatening deterioration of the following conditions:  Respiratory failure   Critical care was time spent personally by me on the following activities:  Development of treatment plan with patient or surrogate, discussions with consultants, evaluation of patient's response to treatment, examination of patient, ordering and review of laboratory studies, ordering and review of radiographic studies, ordering and performing treatments and interventions, pulse oximetry, re-evaluation of  patient's condition and review of old charts .1-3 Lead EKG Interpretation  Performed by: Claudene Rover, MD Authorized by: Claudene Rover, MD     Interpretation: abnormal     ECG rate:  120   ECG rate assessment: tachycardic     Rhythm: sinus tachycardia     Ectopy: none     Conduction: normal     Medications  ipratropium-albuterol  (DUONEB) 0.5-2.5 (3) MG/3ML nebulizer solution 9 mL (has no administration in time range)  methylPREDNISolone  sodium succinate (SOLU-MEDROL ) 125 mg/2 mL injection 125 mg (has no administration in time range)  LORazepam  (ATIVAN ) injection 1 mg (has no administration in time range)     IMPRESSION / MDM / ASSESSMENT AND PLAN / ED COURSE  I reviewed the triage vital signs and the nursing notes.  Differential diagnosis includes, but is not limited to, ACS, PTX, PNA, muscle strain/spasm, PE, dissection, anxiety, pleural effusion  {Patient presents with symptoms of an acute illness or injury that is potentially life-threatening.  Patient presents with a fairly severe COPD exacerbation presenting to the ED tripoding, hypoxic and respiratory distress.  Improved clinical picture with application of BiPAP, starting DuoNebs, steroids.  Will provide small dose of IV Ativan  to facilitate BiPAP.  Clear CXR that is hyperinflated, CBC without acute features, awaiting remainder of serum workup.  Will admit after this.      FINAL CLINICAL IMPRESSION(S) / ED DIAGNOSES   Final diagnoses:  COPD exacerbation (HCC)  Hypoxia     Rx / DC Orders   ED Discharge Orders     None        Note:  This document was prepared using Dragon voice recognition software and may include unintentional dictation errors.   Claudene Rover, MD 06/08/24 2336  "

## 2024-06-09 DIAGNOSIS — Z23 Encounter for immunization: Secondary | ICD-10-CM | POA: Diagnosis present

## 2024-06-09 DIAGNOSIS — Z888 Allergy status to other drugs, medicaments and biological substances status: Secondary | ICD-10-CM | POA: Diagnosis not present

## 2024-06-09 DIAGNOSIS — Z1152 Encounter for screening for COVID-19: Secondary | ICD-10-CM | POA: Diagnosis not present

## 2024-06-09 DIAGNOSIS — Z91040 Latex allergy status: Secondary | ICD-10-CM | POA: Diagnosis not present

## 2024-06-09 DIAGNOSIS — J9621 Acute and chronic respiratory failure with hypoxia: Secondary | ICD-10-CM | POA: Diagnosis present

## 2024-06-09 DIAGNOSIS — R0603 Acute respiratory distress: Secondary | ICD-10-CM | POA: Diagnosis present

## 2024-06-09 DIAGNOSIS — Z7951 Long term (current) use of inhaled steroids: Secondary | ICD-10-CM | POA: Diagnosis not present

## 2024-06-09 DIAGNOSIS — Z885 Allergy status to narcotic agent status: Secondary | ICD-10-CM | POA: Diagnosis not present

## 2024-06-09 DIAGNOSIS — Z880 Allergy status to penicillin: Secondary | ICD-10-CM | POA: Diagnosis not present

## 2024-06-09 DIAGNOSIS — J441 Chronic obstructive pulmonary disease with (acute) exacerbation: Secondary | ICD-10-CM | POA: Diagnosis present

## 2024-06-09 DIAGNOSIS — J984 Other disorders of lung: Secondary | ICD-10-CM | POA: Diagnosis present

## 2024-06-09 DIAGNOSIS — Z9981 Dependence on supplemental oxygen: Secondary | ICD-10-CM | POA: Diagnosis not present

## 2024-06-09 DIAGNOSIS — Z881 Allergy status to other antibiotic agents status: Secondary | ICD-10-CM | POA: Diagnosis not present

## 2024-06-09 DIAGNOSIS — Z90710 Acquired absence of both cervix and uterus: Secondary | ICD-10-CM | POA: Diagnosis not present

## 2024-06-09 DIAGNOSIS — Z79899 Other long term (current) drug therapy: Secondary | ICD-10-CM | POA: Diagnosis not present

## 2024-06-09 DIAGNOSIS — F17210 Nicotine dependence, cigarettes, uncomplicated: Secondary | ICD-10-CM | POA: Diagnosis present

## 2024-06-09 LAB — BLOOD GAS, VENOUS
Acid-Base Excess: 3.6 mmol/L — ABNORMAL HIGH (ref 0.0–2.0)
Bicarbonate: 26.5 mmol/L (ref 20.0–28.0)
O2 Saturation: 99.2 %
Patient temperature: 37
pCO2, Ven: 34 mmHg — ABNORMAL LOW (ref 44–60)
pH, Ven: 7.5 — ABNORMAL HIGH (ref 7.25–7.43)
pO2, Ven: 161 mmHg — ABNORMAL HIGH (ref 32–45)

## 2024-06-09 LAB — RESP PANEL BY RT-PCR (RSV, FLU A&B, COVID)  RVPGX2
Influenza A by PCR: NEGATIVE
Influenza B by PCR: NEGATIVE
Resp Syncytial Virus by PCR: NEGATIVE
SARS Coronavirus 2 by RT PCR: NEGATIVE

## 2024-06-09 LAB — BASIC METABOLIC PANEL WITH GFR
Anion gap: 13 (ref 5–15)
BUN: 16 mg/dL (ref 6–20)
CO2: 26 mmol/L (ref 22–32)
Calcium: 9.9 mg/dL (ref 8.9–10.3)
Chloride: 101 mmol/L (ref 98–111)
Creatinine, Ser: 0.59 mg/dL (ref 0.44–1.00)
GFR, Estimated: >60 mL/min
Glucose, Bld: 105 mg/dL — ABNORMAL HIGH (ref 70–99)
Potassium: 4.4 mmol/L (ref 3.5–5.1)
Sodium: 140 mmol/L (ref 135–145)

## 2024-06-09 LAB — PROCALCITONIN: Procalcitonin: 0.12 ng/mL

## 2024-06-09 LAB — TROPONIN T, HIGH SENSITIVITY
Troponin T High Sensitivity: <15 ng/L (ref 0–19)
Troponin T High Sensitivity: <15 ng/L (ref 0–19)

## 2024-06-09 LAB — MAGNESIUM: Magnesium: 2.4 mg/dL (ref 1.7–2.4)

## 2024-06-09 MED ORDER — SUMATRIPTAN SUCCINATE 50 MG PO TABS: 50.0000 mg | ORAL_TABLET | Freq: Every day | ORAL | Status: DC | PRN

## 2024-06-09 MED ORDER — TIZANIDINE HCL 4 MG PO TABS: 4.0000 mg | ORAL_TABLET | Freq: Every evening | ORAL | Status: DC | PRN

## 2024-06-09 MED ORDER — TRAZODONE HCL 100 MG PO TABS
100.0000 mg | ORAL_TABLET | Freq: Every day | ORAL | Status: DC
  Filled 2024-06-09 (×2): qty 1

## 2024-06-09 MED ORDER — ACETAMINOPHEN 650 MG RE SUPP: 650.0000 mg | Freq: Four times a day (QID) | RECTAL | Status: DC | PRN

## 2024-06-09 MED ORDER — METHYLPREDNISOLONE SODIUM SUCC 40 MG IJ SOLR
40.0000 mg | Freq: Two times a day (BID) | INTRAMUSCULAR | Status: AC
  Administered 2024-06-09 (×2): 40 mg via INTRAVENOUS
  Filled 2024-06-09 (×2): qty 1

## 2024-06-09 MED ORDER — OXYCODONE-ACETAMINOPHEN 5-325 MG PO TABS
1.0000 | ORAL_TABLET | Freq: Four times a day (QID) | ORAL | Status: DC | PRN
  Administered 2024-06-09 – 2024-06-12 (×12): 1 via ORAL
  Filled 2024-06-09 (×12): qty 1

## 2024-06-09 MED ORDER — DEXTROMETHORPHAN POLISTIREX ER 30 MG/5ML PO SUER
15.0000 mg | Freq: Two times a day (BID) | ORAL | Status: DC
  Administered 2024-06-09 – 2024-06-12 (×8): 15 mg via ORAL
  Filled 2024-06-09 (×8): qty 5

## 2024-06-09 MED ORDER — IPRATROPIUM-ALBUTEROL 0.5-2.5 (3) MG/3ML IN SOLN
3.0000 mL | Freq: Four times a day (QID) | RESPIRATORY_TRACT | Status: DC
  Administered 2024-06-09 – 2024-06-10 (×7): 3 mL via RESPIRATORY_TRACT
  Filled 2024-06-09 (×7): qty 3

## 2024-06-09 MED ORDER — ENOXAPARIN SODIUM 40 MG/0.4ML IJ SOSY
40.0000 mg | PREFILLED_SYRINGE | INTRAMUSCULAR | Status: DC
  Administered 2024-06-09 – 2024-06-12 (×4): 40 mg via SUBCUTANEOUS
  Filled 2024-06-09 (×4): qty 0.4

## 2024-06-09 MED ORDER — PANTOPRAZOLE SODIUM 40 MG PO TBEC
40.0000 mg | DELAYED_RELEASE_TABLET | Freq: Two times a day (BID) | ORAL | Status: DC
  Administered 2024-06-09 – 2024-06-12 (×7): 40 mg via ORAL
  Filled 2024-06-09 (×7): qty 1

## 2024-06-09 MED ORDER — ACETAMINOPHEN 325 MG PO TABS
650.0000 mg | ORAL_TABLET | Freq: Four times a day (QID) | ORAL | Status: DC | PRN
  Administered 2024-06-09: 650 mg via ORAL
  Filled 2024-06-09: qty 2

## 2024-06-09 MED ORDER — ONDANSETRON HCL 4 MG/2ML IJ SOLN
4.0000 mg | Freq: Four times a day (QID) | INTRAMUSCULAR | Status: DC | PRN
  Administered 2024-06-09 – 2024-06-12 (×2): 4 mg via INTRAVENOUS
  Filled 2024-06-09 (×2): qty 2

## 2024-06-09 MED ORDER — ALBUTEROL SULFATE (2.5 MG/3ML) 0.083% IN NEBU
2.5000 mg | INHALATION_SOLUTION | RESPIRATORY_TRACT | Status: DC | PRN
  Administered 2024-06-12: 2.5 mg via RESPIRATORY_TRACT
  Filled 2024-06-09: qty 3

## 2024-06-09 MED ORDER — ONDANSETRON HCL 4 MG PO TABS
4.0000 mg | ORAL_TABLET | Freq: Four times a day (QID) | ORAL | Status: DC | PRN
  Administered 2024-06-09 – 2024-06-12 (×11): 4 mg via ORAL
  Filled 2024-06-09 (×11): qty 1

## 2024-06-09 MED ORDER — PREDNISONE 20 MG PO TABS
40.0000 mg | ORAL_TABLET | Freq: Every day | ORAL | Status: DC
  Administered 2024-06-10 – 2024-06-12 (×3): 40 mg via ORAL
  Filled 2024-06-09 (×4): qty 2

## 2024-06-09 NOTE — ED Provider Notes (Signed)
 11:30 PM  Assumed care at shift change.  Patient here with COPD exacerbation on BiPAP.  Chest x-ray reviewed and interpreted by myself and the radiologist and is clear.  Labs pending.  Patient will need admission.  1:30 AM  Pt's labs show normal hemoglobin, electrolytes.  Reassuring blood gas.  Negative troponin x 2.  COVID, flu and RSV negative.  Patient clinically improving on BiPAP.  Discussed with Dr. Cleatus with hospitalist service for admission.   CRITICAL CARE Performed by: Josette Sink   Total critical care time: 30 minutes  Critical care time was exclusive of separately billable procedures and treating other patients.  Critical care was necessary to treat or prevent imminent or life-threatening deterioration.  Critical care was time spent personally by me on the following activities: development of treatment plan with patient and/or surrogate as well as nursing, discussions with consultants, evaluation of patient's response to treatment, examination of patient, obtaining history from patient or surrogate, ordering and performing treatments and interventions, ordering and review of laboratory studies, ordering and review of radiographic studies, pulse oximetry and re-evaluation of patient's condition.    Talisha Erby, Josette SAILOR, DO 06/09/24 [phone]

## 2024-06-09 NOTE — H&P (Signed)
 " History and Physical    Patient: Grace King FMW:[id number] DOB: [birth year] DOA: 06/08/2024 DOS: the patient was seen and examined on 06/09/2024 PCP: Nathen Dess, MD  Patient coming from: Home  Chief Complaint:  Chief Complaint  Patient presents with   Respiratory Distress    HPI: Grace King is a 54 y.o. female with medical history significant for COPD on home O2 at 2 L being admitted with COPD exacerbation with worsening of respiratory failure requiring BiPAP.  She arrived by EMS in respiratory distress, after complaining of a 4-day history of wheezing not responding to home bronchodilators. In the ED she was tachycardic and tachypneic but afebrile, BP 154/98. VBG with pH 7.5 and pCO2 34.  CBC and BMP unremarkable.  Troponin and potassium normal.  Respiratory viral negative for COVID flu and RSV.EKG showed sinus tachycardia at 118 and chest x-ray showed hyperinflated lungs with emphysematous changes. Patient treated with albuterol  and Solu-Medrol  and given a dose of lorazepam  Admission requested     Past Medical History:  Diagnosis Date   Asthma    COPD (chronic obstructive pulmonary disease) (HCC)    Lupus    Past Surgical History:  Procedure Laterality Date   ABDOMINAL HYSTERECTOMY     BACK SURGERY     Social History:  reports that she has been smoking cigarettes. She has never used smokeless tobacco. She reports that she does not drink alcohol and does not use drugs.  Allergies[1]  History reviewed. No pertinent family history.  Prior to Admission medications  Medication Sig Start Date End Date Taking? Authorizing Provider  albuterol  (PROVENTIL ) (2.5 MG/3ML) 0.083% nebulizer solution Take 3 mLs (2.5 mg total) by nebulization every 4 (four) hours as needed for wheezing or shortness of breath. 10/28/23 10/27/24  Willo Dunnings, MD  amLODipine  (NORVASC ) 5 MG tablet Take 1 tablet (5 mg total) by mouth daily. Patient not taking: Reported on 12/04/2023 07/12/21   Samtani,  Jai-Gurmukh, MD  BREO ELLIPTA  100-25 MCG/ACT AEPB Inhale 1 puff into the lungs daily.    [provider]  budesonide -formoterol  (SYMBICORT) 160-4.5 MCG/ACT inhaler Inhale 2 puffs into the lungs 2 (two) times daily.    [provider]  Cholecalciferol 50 MCG (2000 UT) TABS Take 4,000 Units by mouth daily. 01/25/22   [provider]  cyanocobalamin  (VITAMIN B12) 1000 MCG/ML injection Inject 1,000 mcg into the muscle once a week. 08/09/22   [provider]  dextromethorphan  (DELSYM ) 30 MG/5ML liquid Take 2.5 mLs (15 mg total) by mouth 2 (two) times daily. Patient not taking: Reported on 12/04/2023 07/11/21   Samtani, Jai-Gurmukh, MD  EPINEPHrine  0.3 mg/0.3 mL IJ SOAJ injection Inject 0.3 mLs (0.3 mg total) into the muscle as needed for anaphylaxis. 11/14/19   Edelmiro Leash, MD  guaiFENesin  (MUCINEX ) 600 MG 12 hr tablet Take 1 tablet (600 mg total) by mouth 2 (two) times daily as needed for cough or to loosen phlegm. 07/11/21   Samtani, Jai-Gurmukh, MD  INCRUSE ELLIPTA  62.5 MCG/ACT AEPB Inhale 1 puff into the lungs daily.    [provider]  montelukast  (SINGULAIR ) 10 MG tablet Take 10 mg by mouth at bedtime.     [provider]  oxyCODONE -acetaminophen  (PERCOCET/ROXICET) 5-325 MG tablet Take 1 tablet by mouth every 6 (six) hours as needed for severe pain.    [provider]  pantoprazole  (PROTONIX ) 40 MG tablet Take 40 mg by mouth 2 (two) times daily.    [provider]  SUMAtriptan  (IMITREX ) 50 MG  tablet Take 50 mg by mouth daily as needed for migraine. (May repeat after 2 hours if needed)    [provider]  tiotropium (SPIRIVA  HANDIHALER) 18 MCG inhalation capsule Place 18 mcg into inhaler and inhale daily.    [provider]  tiZANidine  (ZANAFLEX ) 4 MG tablet Take 4 mg by mouth at bedtime as needed. 08/09/23   [provider]  traZODone  (DESYREL ) 100 MG tablet Take 100 mg by mouth at bedtime.    [provider]    Physical Exam: Vitals:   06/08/24 2223 06/08/24 2227 06/08/24 2232  BP:   (!) 154/98  Pulse:   (!) 118  Resp:   (!) 29  Temp:   98.4 F (36.9 C)  TempSrc:   Oral  SpO2: 100%  100%  Weight:  65.7 kg   Height:  5' 7 (1.702 m)    Physical Exam  Labs on Admission: I have personally reviewed following labs and imaging studies  CBC: Recent Labs  Lab 06/08/24 2302  WBC 10.5  HGB 14.0  HCT 43.1  MCV 94.1  PLT 179   Basic Metabolic Panel: Recent Labs  Lab 06/08/24 2302 06/08/24 2307  NA 140  --   K 4.4  --   CL 101  --   CO2 26  --   GLUCOSE 105*  --   BUN 16  --   CREATININE 0.59  --   CALCIUM 9.9  --   MG  --  2.4   GFR: Estimated Creatinine Clearance: 80.9 mL/min (by C-G formula based on SCr of 0.59 mg/dL). Liver Function Tests: No results for input(s): AST, ALT, ALKPHOS, BILITOT, PROT, ALBUMIN in the last 168 hours. No results for input(s): LIPASE, AMYLASE in the last 168 hours. No results for input(s): AMMONIA in the last 168 hours. Coagulation Profile: No results for input(s): INR, PROTIME in the last 168 hours. Cardiac Enzymes: No results for input(s): CKTOTAL, CKMB, CKMBINDEX, TROPONINI in the last 168 hours. BNP (last 3 results) No results for input(s): PROBNP in the last 8760 hours. HbA1C: No results for input(s): HGBA1C in the last 72 hours. CBG: No results for input(s): GLUCAP in the last 168 hours. Lipid Profile: No results for input(s): CHOL, HDL, LDLCALC, TRIG, CHOLHDL, LDLDIRECT in the last 72 hours. Thyroid  Function Tests: No results for input(s): TSH, T4TOTAL, FREET4, T3FREE, THYROIDAB in the last 72 hours. Anemia Panel: No results for input(s): VITAMINB12, FOLATE, FERRITIN, TIBC, IRON, RETICCTPCT in the last 72 hours. Urine analysis:    Component Value Date/Time   COLORURINE YELLOW (A) 09/08/2022 1823   APPEARANCEUR HAZY (A) 09/08/2022 1823    LABSPEC 1.019 09/08/2022 1823   PHURINE 5.0 09/08/2022 1823   GLUCOSEU NEGATIVE 09/08/2022 1823   HGBUR NEGATIVE 09/08/2022 1823   BILIRUBINUR NEGATIVE 09/08/2022 1823   KETONESUR 80 (A) 09/08/2022 1823   PROTEINUR NEGATIVE 09/08/2022 1823   NITRITE NEGATIVE 09/08/2022 1823   LEUKOCYTESUR NEGATIVE 09/08/2022 1823    Radiological Exams on Admission: DG Chest Portable 1 View Result Date: 06/08/2024 EXAM: 1 VIEW(S) XRAY OF THE CHEST 06/08/2024 10:49:00 PM COMPARISON: 12/03/2023 CLINICAL HISTORY: FINDINGS: LUNGS AND PLEURA: Hyperinflated lungs. Emphysematous changes, most pronounced in upper lobes. Lung base opacities are consistent with overlying soft tissue attenuation. No pleural effusion. No pneumothorax. HEART AND MEDIASTINUM: No acute abnormality of the cardiac and mediastinal silhouettes. BONES AND SOFT TISSUES: No acute osseous abnormality. IMPRESSION: 1. Hyperinflated lungs with emphysematous changes, most pronounced in the upper lobes. Electronically signed by:  Elsie Gravely MD 06/08/2024 11:00 PM EST RP Workstation: [id number]   Data Reviewed for HPI: Relevant notes from primary care and specialist visits, past discharge summaries as available in EHR, including Care Everywhere. Prior diagnostic testing as pertinent to current admission diagnoses Updated medications and problem lists for reconciliation ED course, including vitals, labs, imaging, treatment and response to treatment Triage notes, nursing and pharmacy notes and ED provider's notes Notable results as noted above in HPI      Assessment and Plan: COPD exacerbation (HCC) Acute on chronic respiratory failure with hypoxia Patient presents respiratory distress, tachycardic and tachypneic requiring BiPAP Scheduled and as needed nebulized bronchodilators IV steroids, antitussives, flutter valve Wean as tolerated to baseline O2 requirement     DVT prophylaxis: Lovenox   Consults: none  Advance Care Planning:   Code  Status: Prior   Family Communication: none  Disposition Plan: Back to previous home environment  Severity of Illness: The appropriate patient status for this patient is OBSERVATION. Observation status is judged to be reasonable and necessary in order to provide the required intensity of service to ensure the patient's safety. The patient's presenting symptoms, physical exam findings, and initial radiographic and laboratory data in the context of their medical condition is felt to place them at decreased risk for further clinical deterioration. Furthermore, it is anticipated that the patient will be medically stable for discharge from the hospital within 2 midnights of admission.   Author: Delayne LULLA Solian, MD 06/09/2024 12:42 AM  For on call review [email or web address].      [1]  Allergies Allergen Reactions   Codeine  Itching    Pills   Cucumber Extract Anaphylaxis    Pickles    Diclofenac Potassium Hives   Duloxetine Rash   Hydrocodone-Acetaminophen  Anaphylaxis   Oxycodone  Itching   Pregabalin Hives and Rash   Shellfish Allergy Anaphylaxis   Erythromycin    Latex    Morphine Itching    Patient itched after administering 4 mg morphine   Other     pickles   Peanut-Containing Drug Products    Penicillins    Ketorolac Rash   Tramadol Rash   "

## 2024-06-09 NOTE — ED Notes (Signed)
 Pt has independently taken bipap off at this time. This RN placed pt on 4L [place]. Pt able to ambulate to restroom with no complaints of shob.

## 2024-06-09 NOTE — Progress Notes (Signed)
 Patient seen and examined in the emergency department.  Interval events noted.  She has no complaints at this time.  She breathing is a little better today.  She is off of BiPAP.  Vital signs are stable.  She is tolerating 4 L oxygen  via [place].  Grace King is a 54 y.o. female with medical history significant for COPD on home O2 at 2 L who presented to the ED with cough shortness of breath and decreased oxygen  saturation.  Oxygen  saturation was 74% at home.  She was admitted to the hospital for COPD exacerbation and acute on chronic hypoxic respiratory failure.  She initially required BiPAP for respiratory failure but this has been tapered off and she is tolerating 4 L oxygen  via nasal cannula.  Continue steroids and bronchodilators for COPD.

## 2024-06-09 NOTE — Assessment & Plan Note (Signed)
 Acute on chronic respiratory failure with hypoxia Patient presents respiratory distress, tachycardic and tachypneic requiring BiPAP Scheduled and as needed nebulized bronchodilators IV steroids, antitussives, flutter valve Wean as tolerated to baseline O2 requirement

## 2024-06-10 ENCOUNTER — Other Ambulatory Visit: Payer: Self-pay

## 2024-06-10 DIAGNOSIS — J441 Chronic obstructive pulmonary disease with (acute) exacerbation: Secondary | ICD-10-CM

## 2024-06-10 MED ORDER — IPRATROPIUM-ALBUTEROL 0.5-2.5 (3) MG/3ML IN SOLN
3.0000 mL | Freq: Three times a day (TID) | RESPIRATORY_TRACT | Status: DC
  Administered 2024-06-11 – 2024-06-12 (×4): 3 mL via RESPIRATORY_TRACT
  Filled 2024-06-10 (×4): qty 3

## 2024-06-10 MED ORDER — UMECLIDINIUM BROMIDE 62.5 MCG/ACT IN AEPB
1.0000 | INHALATION_SPRAY | Freq: Every day | RESPIRATORY_TRACT | Status: DC
  Administered 2024-06-11 – 2024-06-12 (×2): 1 via RESPIRATORY_TRACT
  Filled 2024-06-10: qty 7

## 2024-06-10 MED ORDER — ALUM & MAG HYDROXIDE-SIMETH 200-200-20 MG/5 ML NICU TOPICAL: 1.0000 | Freq: Once | TOPICAL | Status: DC

## 2024-06-10 MED ORDER — TIOTROPIUM BROMIDE 18 MCG IN CAPS: 18.0000 ug | ORAL_CAPSULE | Freq: Every day | RESPIRATORY_TRACT | Status: DC

## 2024-06-10 MED ORDER — ALUM & MAG HYDROXIDE-SIMETH 200-200-20 MG/5ML PO SUSP
30.0000 mL | Freq: Once | ORAL | Status: AC
  Administered 2024-06-10: 30 mL via ORAL
  Filled 2024-06-10: qty 30

## 2024-06-10 MED ORDER — FLUTICASONE FUROATE-VILANTEROL 100-25 MCG/ACT IN AEPB
1.0000 | INHALATION_SPRAY | Freq: Every day | RESPIRATORY_TRACT | Status: DC
  Administered 2024-06-10 – 2024-06-12 (×3): 1 via RESPIRATORY_TRACT
  Filled 2024-06-10: qty 28

## 2024-06-10 MED ORDER — MONTELUKAST SODIUM 10 MG PO TABS
10.0000 mg | ORAL_TABLET | Freq: Every day | ORAL | Status: DC
  Administered 2024-06-10 – 2024-06-11 (×2): 10 mg via ORAL
  Filled 2024-06-10 (×2): qty 1

## 2024-06-10 MED ORDER — INFLUENZA VIRUS VACC SPLIT PF (FLUZONE) 0.5 ML IM SUSY
0.5000 mL | PREFILLED_SYRINGE | INTRAMUSCULAR | Status: AC
  Administered 2024-06-12: 0.5 mL via INTRAMUSCULAR
  Filled 2024-06-10: qty 0.5

## 2024-06-10 NOTE — Plan of Care (Signed)

## 2024-06-10 NOTE — Progress Notes (Signed)
 " PROGRESS NOTE    Grace King  FMW:[id number] DOB: 1971/02/12 DOA: 06/08/2024 PCP: Grace Dess, MD  Chief Complaint  Patient presents with   Respiratory Distress    Hospital Course:  Grace King is a 54 year old female with COPD chronically on 2 L at home was admitted for COPD exacerbation with worsening respiratory failure initially requiring BiPAP.  RVP negative.  CXR without evidence of pneumonia.  She was started on steroids and DuoNebs and admitted.  Subjective: No acute events overnight.  Patient reports that she starting to feel better.  Still very dyspneic when ambulating.   Objective: Vitals:   06/10/24 0707 06/10/24 0835 06/10/24 1224 06/10/24 1712  BP: 133/85 124/67 129/65 134/79  Pulse: 83 79 95 75  Resp: 18 17  20   Temp: 98.6 F (37 C) 98 F (36.7 C) 98 F (36.7 C) 98.8 F (37.1 C)  TempSrc:   Oral   SpO2: 99% 98% 96% 100%  Weight:      Height:        Intake/Output Summary (Last 24 hours) at 06/10/2024 1722 Last data filed at 06/10/2024 1420 Gross per 24 hour  Intake 360 ml  Output 200 ml  Net 160 ml   Filed Weights   06/08/24 2227  Weight: 65.7 kg    Examination: General exam: Appears calm and comfortable, NAD  Respiratory system: No work of breathing, symmetric chest wall expansion, no wheeze.  On 4 L [place] Cardiovascular system: S1 & S2 heard, RRR.  Gastrointestinal system: Abdomen is nondistended, soft and nontender.  Neuro: Alert and oriented.  Assessment & Plan:  Principal Problem:   COPD with acute exacerbation (HCC) Active Problems:   COPD exacerbation (HCC)   Acute on chronic respiratory failure with hypoxia (HCC)    Acute on chronic respiratory failure COPD exacerbation - Initially required BiPAP, tachycardic, tachypneic, in respiratory distress on arrival - Has now been weaned to 4 L, uses 2 L at home - Continue with scheduled and as needed nebulizer therapy - Continue with steroids, antitussives, flutter valve and I-S -  Patient attempted walk trial today on 2 L but desaturated to 86%. - Resume home inhalers   DVT prophylaxis: Lovenox    Code Status: Full Code Disposition:  Obs until back to home O2 requirement  Consultants:    Procedures:    Antimicrobials:  Anti-infectives (From admission, onward)    None       Data Reviewed: I have personally reviewed following labs and imaging studies CBC: Recent Labs  Lab 06/08/24 2302  WBC 10.5  HGB 14.0  HCT 43.1  MCV 94.1  PLT 179   Basic Metabolic Panel: Recent Labs  Lab 06/08/24 2302 06/08/24 2307  NA 140  --   K 4.4  --   CL 101  --   CO2 26  --   GLUCOSE 105*  --   BUN 16  --   CREATININE 0.59  --   CALCIUM 9.9  --   MG  --  2.4   GFR: Estimated Creatinine Clearance: 80.9 mL/min (by C-G formula based on SCr of 0.59 mg/dL). Liver Function Tests: No results for input(s): AST, ALT, ALKPHOS, BILITOT, PROT, ALBUMIN in the last 168 hours. CBG: No results for input(s): GLUCAP in the last 168 hours.  Recent Results (from the past 240 hours)  Blood culture (routine x 2)     Status: None (Preliminary result)   Collection Time: 06/08/24 11:02 PM   Specimen: BLOOD  Result Value  Ref Range Status   Specimen Description BLOOD BLOOD LEFT WRIST  Final   Special Requests   Final    BOTTLES DRAWN AEROBIC AND ANAEROBIC Blood Culture results may not be optimal due to an inadequate volume of blood received in culture bottles   Culture   Final    NO GROWTH 2 DAYS Performed at [facility], [address], [place], [place] [address]    Report Status PENDING  Incomplete  Blood culture (routine x 2)     Status: None (Preliminary result)   Collection Time: 06/08/24 11:02 PM   Specimen: BLOOD  Result Value Ref Range Status   Specimen Description BLOOD BLOOD RIGHT FOREARM  Final   Special Requests   Final    BOTTLES DRAWN AEROBIC AND ANAEROBIC Blood Culture results may not be optimal due to an inadequate volume of blood  received in culture bottles   Culture   Final    NO GROWTH 2 DAYS Performed at [facility], [address] [place], [place] [address]    Report Status PENDING  Incomplete  Resp panel by RT-PCR (RSV, Flu A&B, Covid) Anterior Nasal Swab     Status: None   Collection Time: 06/08/24 11:02 PM   Specimen: Anterior Nasal Swab  Result Value Ref Range Status   SARS Coronavirus 2 by RT PCR NEGATIVE NEGATIVE Final    Comment: (NOTE) SARS-CoV-2 target nucleic acids are NOT DETECTED.  The SARS-CoV-2 RNA is generally detectable in upper respiratory specimens during the acute phase of infection. The lowest concentration of SARS-CoV-2 viral copies this assay can detect is 138 copies/mL. A negative result does not preclude SARS-Cov-2 infection and should not be used as the sole basis for treatment or other patient management decisions. A negative result may occur with  improper specimen collection/handling, submission of specimen other than nasopharyngeal swab, presence of viral mutation(s) within the areas targeted by this assay, and inadequate number of viral copies(<138 copies/mL). A negative result must be combined with clinical observations, patient history, and epidemiological information. The expected result is Negative.  Fact Sheet for Patients:  bloggercourse.com  Fact Sheet for Healthcare Providers:  seriousbroker.it  This test is no t yet approved or cleared by the [place]  FDA and  has been authorized for detection and/or diagnosis of SARS-CoV-2 by FDA under an Emergency Use Authorization (EUA). This EUA will remain  in effect (meaning this test can be used) for the duration of the COVID-19 declaration under Section 564(b)(1) of the Act, 21 U.S.C.section 360bbb-3(b)(1), unless the authorization is terminated  or revoked sooner.       Influenza A by PCR NEGATIVE NEGATIVE Final   Influenza B by PCR NEGATIVE  NEGATIVE Final    Comment: (NOTE) The Xpert Xpress SARS-CoV-2/FLU/RSV plus assay is intended as an aid in the diagnosis of influenza from Nasopharyngeal swab specimens and should not be used as a sole basis for treatment. Nasal washings and aspirates are unacceptable for Xpert Xpress SARS-CoV-2/FLU/RSV testing.  Fact Sheet for Patients: bloggercourse.com  Fact Sheet for Healthcare Providers: seriousbroker.it  This test is not yet approved or cleared by the [place]  FDA and has been authorized for detection and/or diagnosis of SARS-CoV-2 by FDA under an Emergency Use Authorization (EUA). This EUA will remain in effect (meaning this test can be used) for the duration of the COVID-19 declaration under Section 564(b)(1) of the Act, 21 U.S.C. section 360bbb-3(b)(1), unless the authorization is terminated or revoked.     Resp Syncytial  Virus by PCR NEGATIVE NEGATIVE Final    Comment: (NOTE) Fact Sheet for Patients: bloggercourse.com  Fact Sheet for Healthcare Providers: seriousbroker.it  This test is not yet approved or cleared by the [place]  FDA and has been authorized for detection and/or diagnosis of SARS-CoV-2 by FDA under an Emergency Use Authorization (EUA). This EUA will remain in effect (meaning this test can be used) for the duration of the COVID-19 declaration under Section 564(b)(1) of the Act, 21 U.S.C. section 360bbb-3(b)(1), unless the authorization is terminated or revoked.  Performed at [facility], [address], [place], [place] [address]      Radiology Studies: DG Chest Portable 1 View Result Date: 06/08/2024 EXAM: 1 VIEW(S) XRAY OF THE CHEST 06/08/2024 10:49:00 PM COMPARISON: 12/03/2023 CLINICAL HISTORY: FINDINGS: LUNGS AND PLEURA: Hyperinflated lungs. Emphysematous changes, most pronounced in upper lobes. Lung base opacities are  consistent with overlying soft tissue attenuation. No pleural effusion. No pneumothorax. HEART AND MEDIASTINUM: No acute abnormality of the cardiac and mediastinal silhouettes. BONES AND SOFT TISSUES: No acute osseous abnormality. IMPRESSION: 1. Hyperinflated lungs with emphysematous changes, most pronounced in the upper lobes. Electronically signed by: Elsie Gravely MD 06/08/2024 11:00 PM EST RP Workstation: [id number]    Scheduled Meds:  dextromethorphan   15 mg Oral BID   enoxaparin  (LOVENOX ) injection  40 mg Subcutaneous Q24H   fluticasone  furoate-vilanterol  1 puff Inhalation Daily   [START ON 06/11/2024] influenza vac split trivalent PF  0.5 mL Intramuscular Tomorrow-1000   ipratropium-albuterol   3 mL Nebulization Q6H   pantoprazole   40 mg Oral BID   predniSONE   40 mg Oral Q breakfast   traZODone   100 mg Oral QHS   Continuous Infusions:   LOS: 1 day  MDM: Patient is high risk for one or more organ failure.  They necessitate ongoing hospitalization for continued IV therapies and subsequent lab monitoring. Total time spent interpreting labs and vitals, reviewing the medical record, coordinating care amongst consultants and care team members, directly assessing and discussing care with the patient and/or family: 55 min  Leonilda Cozby, DO [facility]  To contact the attending physician between 7A-7P please use Epic Chat. To contact the covering physician during after hours 7P-7A, please review Amion.  06/10/2024, 5:22 PM   *This document has been created with the assistance of dictation software. Please excuse typographical errors. *   "

## 2024-06-10 NOTE — Progress Notes (Addendum)
" °   06/10/24 1755  Spiritual Encounters  Type of Visit Initial  Care provided to: Patient  Referral source Nurse (RN/NT/LPN)  Reason for visit Advance directives  OnCall Visit Yes   Chaplain provided paperwork and explained Advance Directive paperwork. "

## 2024-06-10 NOTE — Progress Notes (Signed)
 Mobility Specialist - Progress Note  Pre-mobility: HR, SpO2-94%  During mobility: HR,SpO2-86% recovered to >92 in < 2 min  Post-mobility: HR, SPO2-95%   06/10/24 1224  Therapy Vitals  Temp 98 F (36.7 C)  Temp Source Oral  Pulse Rate 95  BP 129/65  Patient Position (if appropriate) Sitting  Oxygen  Therapy  SpO2 96 %  O2 Device Nasal Cannula  Mobility  Activity Ambulated with assistance;Stood at bedside  Level of Assistance Contact guard assist, steadying assist  Assistive Device None  Distance Ambulated (ft) 40 ft  Range of Motion/Exercises Active  Activity Response Tolerated fair  Mobility visit 1 Mobility  Mobility Specialist Start Time (ACUTE ONLY) 1145  Mobility Specialist Stop Time (ACUTE ONLY) 1212  Mobility Specialist Time Calculation (min) (ACUTE ONLY) 27 min   Pt was at the EOB on O2 @ 2 L upon entry. Pt agreed to mobility. Pt O2 vitals were taken throughout activity as a precaution. Pt is able today to STS independently with no AD. Pt ambulated well. Pt utilized recovery break at the 25 ft mark. Pt did Desat to 86%. Pt took a seat and utilized PBT. Pt recovered shortly to >92% in less than 2 min. Pt did describe chest pain being present. After activity pt returned to the room back in bed with needs in reach.  Clem Rodes Mobility Specialist 06/10/2024, 1:25 PM

## 2024-06-11 LAB — BASIC METABOLIC PANEL WITH GFR
Anion gap: 7 (ref 5–15)
BUN: 22 mg/dL — ABNORMAL HIGH (ref 6–20)
CO2: 30 mmol/L (ref 22–32)
Calcium: 9.3 mg/dL (ref 8.9–10.3)
Chloride: 101 mmol/L (ref 98–111)
Creatinine, Ser: 0.82 mg/dL (ref 0.44–1.00)
GFR, Estimated: >60 mL/min
Glucose, Bld: 101 mg/dL — ABNORMAL HIGH (ref 70–99)
Potassium: 4.5 mmol/L (ref 3.5–5.1)
Sodium: 139 mmol/L (ref 135–145)

## 2024-06-11 NOTE — Progress Notes (Signed)
 " PROGRESS NOTE    Grace King  FMW:[id number] DOB: Mar 07, 1971 DOA: 06/08/2024 PCP: Nathen Dess, MD  Chief Complaint  Patient presents with   Respiratory Distress    Hospital Course:  Grace King is a 54 year old female with COPD chronically on 2 L at home was admitted for COPD exacerbation with worsening respiratory failure initially requiring BiPAP.  RVP negative.  CXR without evidence of pneumonia.  She was started on steroids and DuoNebs and admitted.  Subjective: This morning patient reports she still very dyspneic.  She believes that she needs her Symbicort to truly get over this event.  We discussed that Breo Ellipta  is providing her the same benefit and that we do not carry Symbicort in house.  Objective: Vitals:   06/11/24 0720 06/11/24 0910 06/11/24 1023 06/11/24 1311  BP:  (!) 152/80  120/61  Pulse:  69  79  Resp:  19  17  Temp:  97.8 F (36.6 C)  98.1 F (36.7 C)  TempSrc:      SpO2: 93% 100% 100% 98%  Weight:      Height:        Intake/Output Summary (Last 24 hours) at 06/11/2024 1606 Last data filed at 06/11/2024 1408 Gross per 24 hour  Intake 540 ml  Output 800 ml  Net -260 ml   Filed Weights   06/08/24 2227 06/11/24 0500  Weight: 65.7 kg 65.7 kg    Examination: General exam: Appears calm and comfortable, NAD  Respiratory system: No work of breathing, symmetric chest wall expansion, no wheeze.  On 4 L [place] Cardiovascular system: S1 & S2 heard, RRR.  Gastrointestinal system: Abdomen is nondistended, soft and nontender.  Neuro: Alert and oriented.  Assessment & Plan:  Principal Problem:   COPD with acute exacerbation (HCC) Active Problems:   COPD exacerbation (HCC)   Acute on chronic respiratory failure with hypoxia (HCC)    Acute on chronic respiratory failure COPD exacerbation - Initially required BiPAP, tachycardic, tachypneic, in respiratory distress on arrival - Has been weaned to 3L today but remains very dyspneic. - Patient works at  [employer] and does not use her oxygen  while working.  I fear that if she discharges home today she will desaturate and become hypercapnic at home - Will continue with scheduled and as needed nebulizer therapies - Continue with steroids, antitussive  and home inhalers - Encourage flutter valve and I-S - Improved on walk trial today but still very dyspneic and tachypneic.  Hopefully will be able to discharge home tomorrow  Tobacco abuse - Has been counseled on cessation - Add nicotine patch if desired  DVT prophylaxis: Lovenox    Code Status: Full Code Disposition:  Inpatient until back to home O2 requirement  Consultants:    Procedures:    Antimicrobials:  Anti-infectives (From admission, onward)    None       Data Reviewed: I have personally reviewed following labs and imaging studies CBC: Recent Labs  Lab 06/08/24 2302  WBC 10.5  HGB 14.0  HCT 43.1  MCV 94.1  PLT 179   Basic Metabolic Panel: Recent Labs  Lab 06/08/24 2302 06/08/24 2307 06/11/24 0222  NA 140  --  139  K 4.4  --  4.5  CL 101  --  101  CO2 26  --  30  GLUCOSE 105*  --  101*  BUN 16  --  22*  CREATININE 0.59  --  0.82  CALCIUM 9.9  --  9.3  MG  --  2.4  --    GFR: Estimated Creatinine Clearance: 78.9 mL/min (by C-G formula based on SCr of 0.82 mg/dL). Liver Function Tests: No results for input(s): AST, ALT, ALKPHOS, BILITOT, PROT, ALBUMIN in the last 168 hours. CBG: No results for input(s): GLUCAP in the last 168 hours.  Recent Results (from the past 240 hours)  Blood culture (routine x 2)     Status: None (Preliminary result)   Collection Time: 06/08/24 11:02 PM   Specimen: BLOOD  Result Value Ref Range Status   Specimen Description BLOOD BLOOD LEFT WRIST  Final   Special Requests   Final    BOTTLES DRAWN AEROBIC AND ANAEROBIC Blood Culture results may not be optimal due to an inadequate volume of blood received in culture bottles   Culture   Final    NO GROWTH 2  DAYS Performed at [facility], [address] [place], [place] [address]    Report Status PENDING  Incomplete  Blood culture (routine x 2)     Status: None (Preliminary result)   Collection Time: 06/08/24 11:02 PM   Specimen: BLOOD  Result Value Ref Range Status   Specimen Description BLOOD BLOOD RIGHT FOREARM  Final   Special Requests   Final    BOTTLES DRAWN AEROBIC AND ANAEROBIC Blood Culture results may not be optimal due to an inadequate volume of blood received in culture bottles   Culture   Final    NO GROWTH 2 DAYS Performed at [facility], [address] [place], [place] [address]    Report Status PENDING  Incomplete  Resp panel by RT-PCR (RSV, Flu A&B, Covid) Anterior Nasal Swab     Status: None   Collection Time: 06/08/24 11:02 PM   Specimen: Anterior Nasal Swab  Result Value Ref Range Status   SARS Coronavirus 2 by RT PCR NEGATIVE NEGATIVE Final    Comment: (NOTE) SARS-CoV-2 target nucleic acids are NOT DETECTED.  The SARS-CoV-2 RNA is generally detectable in upper respiratory specimens during the acute phase of infection. The lowest concentration of SARS-CoV-2 viral copies this assay can detect is 138 copies/mL. A negative result does not preclude SARS-Cov-2 infection and should not be used as the sole basis for treatment or other patient management decisions. A negative result may occur with  improper specimen collection/handling, submission of specimen other than nasopharyngeal swab, presence of viral mutation(s) within the areas targeted by this assay, and inadequate number of viral copies(<138 copies/mL). A negative result must be combined with clinical observations, patient history, and epidemiological information. The expected result is Negative.  Fact Sheet for Patients:  bloggercourse.com  Fact Sheet for Healthcare Providers:  seriousbroker.it  This test is no t yet approved or  cleared by the [place]  FDA and  has been authorized for detection and/or diagnosis of SARS-CoV-2 by FDA under an Emergency Use Authorization (EUA). This EUA will remain  in effect (meaning this test can be used) for the duration of the COVID-19 declaration under Section 564(b)(1) of the Act, 21 U.S.C.section 360bbb-3(b)(1), unless the authorization is terminated  or revoked sooner.       Influenza A by PCR NEGATIVE NEGATIVE Final   Influenza B by PCR NEGATIVE NEGATIVE Final    Comment: (NOTE) The Xpert Xpress SARS-CoV-2/FLU/RSV plus assay is intended as an aid in the diagnosis of influenza from Nasopharyngeal swab specimens and should not be used as a sole basis for treatment. Nasal washings and aspirates are unacceptable for Xpert Xpress SARS-CoV-2/FLU/RSV testing.  Fact Sheet for Patients: bloggercourse.com  Fact Sheet for Healthcare Providers: seriousbroker.it  This test is not yet approved or cleared by the [place]  FDA and has been authorized for detection and/or diagnosis of SARS-CoV-2 by FDA under an Emergency Use Authorization (EUA). This EUA will remain in effect (meaning this test can be used) for the duration of the COVID-19 declaration under Section 564(b)(1) of the Act, 21 U.S.C. section 360bbb-3(b)(1), unless the authorization is terminated or revoked.     Resp Syncytial Virus by PCR NEGATIVE NEGATIVE Final    Comment: (NOTE) Fact Sheet for Patients: bloggercourse.com  Fact Sheet for Healthcare Providers: seriousbroker.it  This test is not yet approved or cleared by the [place]  FDA and has been authorized for detection and/or diagnosis of SARS-CoV-2 by FDA under an Emergency Use Authorization (EUA). This EUA will remain in effect (meaning this test can be used) for the duration of the COVID-19 declaration under Section 564(b)(1) of the Act, 21  U.S.C. section 360bbb-3(b)(1), unless the authorization is terminated or revoked.  Performed at [facility], [address] [place], [place] [address]      Radiology Studies: No results found.   Scheduled Meds:  dextromethorphan   15 mg Oral BID   enoxaparin  (LOVENOX ) injection  40 mg Subcutaneous Q24H   fluticasone  furoate-vilanterol  1 puff Inhalation Daily   influenza vac split trivalent PF  0.5 mL Intramuscular Tomorrow-1000   ipratropium-albuterol   3 mL Nebulization TID   montelukast   10 mg Oral QHS   pantoprazole   40 mg Oral BID   predniSONE   40 mg Oral Q breakfast   traZODone   100 mg Oral QHS   umeclidinium bromide   1 puff Inhalation Daily   Continuous Infusions:   LOS: 2 days  MDM: Patient is high risk for one or more organ failure.  They necessitate ongoing hospitalization for continued IV therapies and subsequent lab monitoring. Total time spent interpreting labs and vitals, reviewing the medical record, coordinating care amongst consultants and care team members, directly assessing and discussing care with the patient and/or family: 55 min  Mylan Lengyel, DO [facility]  To contact the attending physician between 7A-7P please use Epic Chat. To contact the covering physician during after hours 7P-7A, please review Amion.  06/11/2024, 4:06 PM   *This document has been created with the assistance of dictation software. Please excuse typographical errors. *   "

## 2024-06-11 NOTE — Plan of Care (Signed)

## 2024-06-11 NOTE — Progress Notes (Signed)
 Patient ambulated in hall on 2 L O2 via [place], O2 sats remain stable between 96-100% however patient tachyneic and having to take rest breaks to maintain stating she is still having trouble catching her breath. Patient returned to bed placed on 3 L  per her request and scheduled neb treatment provided.

## 2024-06-12 ENCOUNTER — Other Ambulatory Visit: Payer: Self-pay

## 2024-06-12 DIAGNOSIS — J9621 Acute and chronic respiratory failure with hypoxia: Principal | ICD-10-CM

## 2024-06-12 MED ORDER — PREDNISONE 50 MG PO TABS
50.0000 mg | ORAL_TABLET | Freq: Every day | ORAL | 0 refills | Status: AC
  Filled 2024-06-12: qty 4, 4d supply, fill #0

## 2024-06-12 MED ORDER — IPRATROPIUM-ALBUTEROL 0.5-2.5 (3) MG/3ML IN SOLN: 3.0000 mL | Freq: Two times a day (BID) | RESPIRATORY_TRACT | Status: DC

## 2024-06-12 NOTE — Plan of Care (Signed)

## 2024-06-12 NOTE — TOC CM/SW Note (Signed)
 Transition of Care [facility]) - Inpatient Brief Assessment   Patient Details  Name: Grace King MRN: [id number] Date of Birth: March 11, 1971  Transition of Care [facility]) CM/SW Contact:    Shasta DELENA Daring, RN Phone Number: 06/12/2024, 9:08 AM   Clinical Narrative:  Transition of Care Department [facility]) has reviewed patient and no TOC needs have been identified at this time. Patient has home O2, 4L at baseline. Tobacco cessation resources added to AVS.  If new patient transition needs arise, please place a TOC consult.   Transition of Care Asessment: Insurance and Status: Insurance coverage has been reviewed Patient has primary care physician: Yes Home environment has been reviewed: [facility] Prior level of function:: Independent. 4 L O2 at baseline Prior/Current Home Services: Current home services (Home Oxygen ) Social Drivers of Health Review: SDOH reviewed interventions complete Readmission risk has been reviewed: Yes Transition of care needs: no transition of care needs at this time b

## 2024-06-12 NOTE — Care Management Important Message (Signed)
 Important Message  Patient Details  Name: Grace King MRN: [id number] Date of Birth: [birth year]   Important Message Given:  Yes - Medicare IM     [name] 06/12/2024, 3:21 PM

## 2024-06-12 NOTE — Plan of Care (Signed)
 Pt insisted on leaving the hospital without receiving the discharge Prednisone  tabs. She said she can no longer wait to leave and she will speak with her primary provider to call the medicine in.  Problem: Education: Goal: Knowledge of General Education information will improve Description: Including pain rating scale, medication(s)/side effects and non-pharmacologic comfort measures Outcome: Adequate for Discharge   Problem: Health Behavior/Discharge Planning: Goal: Ability to manage health-related needs will improve Outcome: Adequate for Discharge   Problem: Clinical Measurements: Goal: Ability to maintain clinical measurements within normal limits will improve Outcome: Adequate for Discharge Goal: Will remain free from infection Outcome: Adequate for Discharge Goal: Diagnostic test results will improve Outcome: Adequate for Discharge Goal: Respiratory complications will improve Outcome: Adequate for Discharge Goal: Cardiovascular complication will be avoided Outcome: Adequate for Discharge   Problem: Activity: Goal: Risk for activity intolerance will decrease Outcome: Adequate for Discharge   Problem: Nutrition: Goal: Adequate nutrition will be maintained Outcome: Adequate for Discharge   Problem: Coping: Goal: Level of anxiety will decrease Outcome: Adequate for Discharge   Problem: Elimination: Goal: Will not experience complications related to bowel motility Outcome: Adequate for Discharge Goal: Will not experience complications related to urinary retention Outcome: Adequate for Discharge   Problem: Pain Managment: Goal: General experience of comfort will improve and/or be controlled Outcome: Adequate for Discharge   Problem: Safety: Goal: Ability to remain free from injury will improve Outcome: Adequate for Discharge   Problem: Skin Integrity: Goal: Risk for impaired skin integrity will decrease Outcome: Adequate for Discharge   Problem: Education: Goal:  Knowledge of disease or condition will improve Outcome: Adequate for Discharge Goal: Knowledge of the prescribed therapeutic regimen will improve Outcome: Adequate for Discharge Goal: Individualized Educational Video(s) Outcome: Adequate for Discharge   Problem: Activity: Goal: Ability to tolerate increased activity will improve Outcome: Adequate for Discharge Goal: Will verbalize the importance of balancing activity with adequate rest periods Outcome: Adequate for Discharge   Problem: Respiratory: Goal: Ability to maintain a clear airway will improve Outcome: Adequate for Discharge Goal: Levels of oxygenation will improve Outcome: Adequate for Discharge Goal: Ability to maintain adequate ventilation will improve Outcome: Adequate for Discharge

## 2024-06-12 NOTE — Discharge Summary (Signed)
 " DISCHARGE SUMMARY    Grace King FMW:[id number] DOB: [birth year] DOA: 06/08/2024  PCP: Nathen Dess, MD  Admit date: 06/08/2024 Discharge date: 06/12/2024   Recommendations for Outpatient Follow-up:  Follow up with PCP and pulmonology for chronic condition management  Hospital Course: Grace King is a 54 year old female with COPD chronically on 2 L at home was admitted for COPD exacerbation with worsening respiratory failure initially requiring BiPAP.  RVP negative.  CXR without evidence of pneumonia.  She was started on steroids and DuoNebs and admitted.  She had gradual improvement and by 1/9 endorses feeling ready for discharge home.   Acute on chronic respiratory failure COPD exacerbation - Initially required BiPAP, tachycardic, tachypneic, in respiratory distress on arrival - Has been weaned to 2 L, passing walk trial now. - On review of her home inhalers she has some redundancies.  These have been scaled back and reviewed with her. - Continue with an additional 3 days of steroids at DC -- Work note provided  Tobacco abuse - Has been counseled on cessation  Discharge Instructions  Discharge Instructions     Call MD for:  difficulty breathing, headache or visual disturbances   Complete by: As directed    Call MD for:  persistant dizziness or light-headedness   Complete by: As directed    Call MD for:  persistant nausea and vomiting   Complete by: As directed    Call MD for:  severe uncontrolled pain   Complete by: As directed    Call MD for:  temperature >100.4   Complete by: As directed    Diet general   Complete by: As directed    Discharge instructions   Complete by: As directed    Follow up with your primary care physician to discuss the medication changes during this admission   Increase activity slowly   Complete by: As directed       Allergies as of 06/12/2024       Reactions   Codeine  Itching   Pills   Cucumber Extract Anaphylaxis   Pickles     Diclofenac Potassium Hives   Duloxetine Rash   Hydrocodone-acetaminophen  Anaphylaxis   Oxycodone  Itching   Pregabalin Hives, Rash   Shellfish Allergy Anaphylaxis   Erythromycin    Latex    Morphine Itching   Patient itched after administering 4 mg morphine   Other    pickles   Peanut-containing Drug Products    Penicillins    Ketorolac Rash   Tramadol Rash        Medication List     STOP taking these medications    Breo Ellipta  100-25 MCG/ACT Aepb Generic drug: fluticasone  furoate-vilanterol   fluticasone -salmeterol 250-50 MCG/ACT Aepb Commonly known as: ADVAIR       TAKE these medications    albuterol  (2.5 MG/3ML) 0.083% nebulizer solution Commonly known as: PROVENTIL  Take 3 mLs (2.5 mg total) by nebulization every 4 (four) hours as needed for wheezing or shortness of breath.   albuterol  108 (90 Base) MCG/ACT inhaler Commonly known as: VENTOLIN  HFA Inhale 2 puffs into the lungs every 6 (six) hours as needed for wheezing.   budesonide -formoterol  160-4.5 MCG/ACT inhaler Commonly known as: SYMBICORT Inhale 2 puffs into the lungs 2 (two) times daily.   Cholecalciferol 50 MCG (2000 UT) Tabs Take 4,000 Units by mouth daily.   cyanocobalamin  1000 MCG/ML injection Commonly known as: VITAMIN B12 Inject 1,000 mcg into the muscle once a week.   dextromethorphan  30 MG/5ML liquid Commonly  known as: DELSYM  Take 2.5 mLs (15 mg total) by mouth 2 (two) times daily.   EPINEPHrine  0.3 mg/0.3 mL Soaj injection Commonly known as: EPI-PEN Inject 0.3 mLs (0.3 mg total) into the muscle as needed for anaphylaxis.   guaiFENesin  600 MG 12 hr tablet Commonly known as: MUCINEX  Take 1 tablet (600 mg total) by mouth 2 (two) times daily as needed for cough or to loosen phlegm.   Incruse Ellipta  62.5 MCG/ACT Aepb Generic drug: umeclidinium bromide  Inhale 1 puff into the lungs daily.   montelukast  10 MG tablet Commonly known as: SINGULAIR  Take 10 mg by mouth at bedtime.    oxyCODONE -acetaminophen  5-325 MG tablet Commonly known as: PERCOCET/ROXICET Take 1 tablet by mouth every 6 (six) hours as needed for severe pain.   pantoprazole  40 MG tablet Commonly known as: PROTONIX  Take 40 mg by mouth 2 (two) times daily.   predniSONE  50 MG tablet Commonly known as: DELTASONE  Take 1 tablet (50 mg total) by mouth daily with breakfast for 4 days.   Spiriva  HandiHaler 18 MCG Caps Generic drug: Tiotropium Bromide  Place 18 mcg into inhaler and inhale daily.   SUMAtriptan  50 MG tablet Commonly known as: IMITREX  Take 50 mg by mouth daily as needed for migraine. (May repeat after 2 hours if needed)   tiZANidine  4 MG tablet Commonly known as: ZANAFLEX  Take 4 mg by mouth at bedtime as needed.   traZODone  100 MG tablet Commonly known as: DESYREL  Take 100 mg by mouth at bedtime.        Allergies[1]  Consultations:    Procedures/Studies: DG Chest Portable 1 View Result Date: 06/08/2024 EXAM: 1 VIEW(S) XRAY OF THE CHEST 06/08/2024 10:49:00 PM COMPARISON: 12/03/2023 CLINICAL HISTORY: FINDINGS: LUNGS AND PLEURA: Hyperinflated lungs. Emphysematous changes, most pronounced in upper lobes. Lung base opacities are consistent with overlying soft tissue attenuation. No pleural effusion. No pneumothorax. HEART AND MEDIASTINUM: No acute abnormality of the cardiac and mediastinal silhouettes. BONES AND SOFT TISSUES: No acute osseous abnormality. IMPRESSION: 1. Hyperinflated lungs with emphysematous changes, most pronounced in the upper lobes. Electronically signed by: Elsie Gravely MD 06/08/2024 11:00 PM EST RP Workstation: [id number]      Discharge Exam: Vitals:   06/12/24 0748 06/12/24 1116  BP:  (!) 112/92  Pulse:  80  Resp:  19  Temp:  98.3 F (36.8 C)  SpO2: 100% 99%   Vitals:   06/12/24 0500 06/12/24 0746 06/12/24 0748 06/12/24 1116  BP:  (!) 148/91  (!) 112/92  Pulse:  62  80  Resp:  15  19  Temp:  98.2 F (36.8 C)  98.3 F (36.8 C)  TempSrc:  Oral   Oral  SpO2:  100% 100% 99%  Weight: 62.5 kg     Height:        Constitutional:  Normal appearance. Non toxic-appearing.  HENT: Head Normocephalic and atraumatic.  Mucous membranes are moist.  Eyes:  Extraocular intact. Conjunctivae normal.  Cardiovascular: Rate and Rhythm: Normal rate and regular rhythm.  Pulmonary: Non labored, symmetric rise of chest wall. No wheezing. Skin: warm and dry. not jaundiced.  Neurological: No focal deficit present. alert. Oriented.  Psychiatric: Mood and Affect congruent.    The results of significant diagnostics from this hospitalization (including imaging, microbiology, ancillary and laboratory) are listed below for reference.     Microbiology: Recent Results (from the past 240 hours)  Blood culture (routine x 2)     Status: None (Preliminary result)   Collection Time: 06/08/24 11:02 PM  Specimen: BLOOD  Result Value Ref Range Status   Specimen Description BLOOD BLOOD LEFT WRIST  Final   Special Requests   Final    BOTTLES DRAWN AEROBIC AND ANAEROBIC Blood Culture results may not be optimal due to an inadequate volume of blood received in culture bottles   Culture   Final    NO GROWTH 4 DAYS Performed at [facility], [address], [place], [place] [address]    Report Status PENDING  Incomplete  Blood culture (routine x 2)     Status: None (Preliminary result)   Collection Time: 06/08/24 11:02 PM   Specimen: BLOOD  Result Value Ref Range Status   Specimen Description BLOOD BLOOD RIGHT FOREARM  Final   Special Requests   Final    BOTTLES DRAWN AEROBIC AND ANAEROBIC Blood Culture results may not be optimal due to an inadequate volume of blood received in culture bottles   Culture   Final    NO GROWTH 4 DAYS Performed at [facility], [address], [place], [place] [address]    Report Status PENDING  Incomplete  Resp panel by RT-PCR (RSV, Flu A&B, Covid) Anterior Nasal Swab     Status: None   Collection Time:  06/08/24 11:02 PM   Specimen: Anterior Nasal Swab  Result Value Ref Range Status   SARS Coronavirus 2 by RT PCR NEGATIVE NEGATIVE Final    Comment: (NOTE) SARS-CoV-2 target nucleic acids are NOT DETECTED.  The SARS-CoV-2 RNA is generally detectable in upper respiratory specimens during the acute phase of infection. The lowest concentration of SARS-CoV-2 viral copies this assay can detect is 138 copies/mL. A negative result does not preclude SARS-Cov-2 infection and should not be used as the sole basis for treatment or other patient management decisions. A negative result may occur with  improper specimen collection/handling, submission of specimen other than nasopharyngeal swab, presence of viral mutation(s) within the areas targeted by this assay, and inadequate number of viral copies(<138 copies/mL). A negative result must be combined with clinical observations, patient history, and epidemiological information. The expected result is Negative.  Fact Sheet for Patients:  bloggercourse.com  Fact Sheet for Healthcare Providers:  seriousbroker.it  This test is no t yet approved or cleared by the [place]  FDA and  has been authorized for detection and/or diagnosis of SARS-CoV-2 by FDA under an Emergency Use Authorization (EUA). This EUA will remain  in effect (meaning this test can be used) for the duration of the COVID-19 declaration under Section 564(b)(1) of the Act, 21 U.S.C.section 360bbb-3(b)(1), unless the authorization is terminated  or revoked sooner.       Influenza A by PCR NEGATIVE NEGATIVE Final   Influenza B by PCR NEGATIVE NEGATIVE Final    Comment: (NOTE) The Xpert Xpress SARS-CoV-2/FLU/RSV plus assay is intended as an aid in the diagnosis of influenza from Nasopharyngeal swab specimens and should not be used as a sole basis for treatment. Nasal washings and aspirates are unacceptable for Xpert Xpress  SARS-CoV-2/FLU/RSV testing.  Fact Sheet for Patients: bloggercourse.com  Fact Sheet for Healthcare Providers: seriousbroker.it  This test is not yet approved or cleared by the [place]  FDA and has been authorized for detection and/or diagnosis of SARS-CoV-2 by FDA under an Emergency Use Authorization (EUA). This EUA will remain in effect (meaning this test can be used) for the duration of the COVID-19 declaration under Section 564(b)(1) of the Act, 21 U.S.C. section 360bbb-3(b)(1), unless the authorization is terminated or revoked.  Resp Syncytial Virus by PCR NEGATIVE NEGATIVE Final    Comment: (NOTE) Fact Sheet for Patients: bloggercourse.com  Fact Sheet for Healthcare Providers: seriousbroker.it  This test is not yet approved or cleared by the [place]  FDA and has been authorized for detection and/or diagnosis of SARS-CoV-2 by FDA under an Emergency Use Authorization (EUA). This EUA will remain in effect (meaning this test can be used) for the duration of the COVID-19 declaration under Section 564(b)(1) of the Act, 21 U.S.C. section 360bbb-3(b)(1), unless the authorization is terminated or revoked.  Performed at [facility], [address] [place], [place] [address]      Labs: BNP (last 3 results) Recent Labs    12/03/23 2220  BNP 62.0   Basic Metabolic Panel: Recent Labs  Lab 06/08/24 2302 06/08/24 2307 06/11/24 0222  NA 140  --  139  K 4.4  --  4.5  CL 101  --  101  CO2 26  --  30  GLUCOSE 105*  --  101*  BUN 16  --  22*  CREATININE 0.59  --  0.82  CALCIUM 9.9  --  9.3  MG  --  2.4  --    Liver Function Tests: No results for input(s): AST, ALT, ALKPHOS, BILITOT, PROT, ALBUMIN in the last 168 hours. No results for input(s): LIPASE, AMYLASE in the last 168 hours. No results for input(s): AMMONIA in the last 168  hours. CBC: Recent Labs  Lab 06/08/24 2302  WBC 10.5  HGB 14.0  HCT 43.1  MCV 94.1  PLT 179   Cardiac Enzymes: No results for input(s): CKTOTAL, CKMB, CKMBINDEX, TROPONINI in the last 168 hours. BNP: Invalid input(s): POCBNP CBG: No results for input(s): GLUCAP in the last 168 hours. D-Dimer No results for input(s): DDIMER in the last 72 hours. Hgb A1c No results for input(s): HGBA1C in the last 72 hours. Lipid Profile No results for input(s): CHOL, HDL, LDLCALC, TRIG, CHOLHDL, LDLDIRECT in the last 72 hours. Thyroid  function studies No results for input(s): TSH, T4TOTAL, T3FREE, THYROIDAB in the last 72 hours.  Invalid input(s): FREET3 Anemia work up No results for input(s): VITAMINB12, FOLATE, FERRITIN, TIBC, IRON, RETICCTPCT in the last 72 hours. Urinalysis    Component Value Date/Time   COLORURINE YELLOW (A) 09/08/2022 1823   APPEARANCEUR HAZY (A) 09/08/2022 1823   LABSPEC 1.019 09/08/2022 1823   PHURINE 5.0 09/08/2022 1823   GLUCOSEU NEGATIVE 09/08/2022 1823   HGBUR NEGATIVE 09/08/2022 1823   BILIRUBINUR NEGATIVE 09/08/2022 1823   KETONESUR 80 (A) 09/08/2022 1823   PROTEINUR NEGATIVE 09/08/2022 1823   NITRITE NEGATIVE 09/08/2022 1823   LEUKOCYTESUR NEGATIVE 09/08/2022 1823   Sepsis Labs Recent Labs  Lab 06/08/24 2302  WBC 10.5   Microbiology Recent Results (from the past 240 hours)  Blood culture (routine x 2)     Status: None (Preliminary result)   Collection Time: 06/08/24 11:02 PM   Specimen: BLOOD  Result Value Ref Range Status   Specimen Description BLOOD BLOOD LEFT WRIST  Final   Special Requests   Final    BOTTLES DRAWN AEROBIC AND ANAEROBIC Blood Culture results may not be optimal due to an inadequate volume of blood received in culture bottles   Culture   Final    NO GROWTH 4 DAYS Performed at [facility], [address] [place], [place] [address]    Report Status PENDING   Incomplete  Blood culture (routine x 2)     Status: None (Preliminary  result)   Collection Time: 06/08/24 11:02 PM   Specimen: BLOOD  Result Value Ref Range Status   Specimen Description BLOOD BLOOD RIGHT FOREARM  Final   Special Requests   Final    BOTTLES DRAWN AEROBIC AND ANAEROBIC Blood Culture results may not be optimal due to an inadequate volume of blood received in culture bottles   Culture   Final    NO GROWTH 4 DAYS Performed at [facility], [address], [place], [place] [address]    Report Status PENDING  Incomplete  Resp panel by RT-PCR (RSV, Flu A&B, Covid) Anterior Nasal Swab     Status: None   Collection Time: 06/08/24 11:02 PM   Specimen: Anterior Nasal Swab  Result Value Ref Range Status   SARS Coronavirus 2 by RT PCR NEGATIVE NEGATIVE Final    Comment: (NOTE) SARS-CoV-2 target nucleic acids are NOT DETECTED.  The SARS-CoV-2 RNA is generally detectable in upper respiratory specimens during the acute phase of infection. The lowest concentration of SARS-CoV-2 viral copies this assay can detect is 138 copies/mL. A negative result does not preclude SARS-Cov-2 infection and should not be used as the sole basis for treatment or other patient management decisions. A negative result may occur with  improper specimen collection/handling, submission of specimen other than nasopharyngeal swab, presence of viral mutation(s) within the areas targeted by this assay, and inadequate number of viral copies(<138 copies/mL). A negative result must be combined with clinical observations, patient history, and epidemiological information. The expected result is Negative.  Fact Sheet for Patients:  bloggercourse.com  Fact Sheet for Healthcare Providers:  seriousbroker.it  This test is no t yet approved or cleared by the [place]  FDA and  has been authorized for detection and/or diagnosis of SARS-CoV-2 by FDA under  an Emergency Use Authorization (EUA). This EUA will remain  in effect (meaning this test can be used) for the duration of the COVID-19 declaration under Section 564(b)(1) of the Act, 21 U.S.C.section 360bbb-3(b)(1), unless the authorization is terminated  or revoked sooner.       Influenza A by PCR NEGATIVE NEGATIVE Final   Influenza B by PCR NEGATIVE NEGATIVE Final    Comment: (NOTE) The Xpert Xpress SARS-CoV-2/FLU/RSV plus assay is intended as an aid in the diagnosis of influenza from Nasopharyngeal swab specimens and should not be used as a sole basis for treatment. Nasal washings and aspirates are unacceptable for Xpert Xpress SARS-CoV-2/FLU/RSV testing.  Fact Sheet for Patients: bloggercourse.com  Fact Sheet for Healthcare Providers: seriousbroker.it  This test is not yet approved or cleared by the [place]  FDA and has been authorized for detection and/or diagnosis of SARS-CoV-2 by FDA under an Emergency Use Authorization (EUA). This EUA will remain in effect (meaning this test can be used) for the duration of the COVID-19 declaration under Section 564(b)(1) of the Act, 21 U.S.C. section 360bbb-3(b)(1), unless the authorization is terminated or revoked.     Resp Syncytial Virus by PCR NEGATIVE NEGATIVE Final    Comment: (NOTE) Fact Sheet for Patients: bloggercourse.com  Fact Sheet for Healthcare Providers: seriousbroker.it  This test is not yet approved or cleared by the [place]  FDA and has been authorized for detection and/or diagnosis of SARS-CoV-2 by FDA under an Emergency Use Authorization (EUA). This EUA will remain in effect (meaning this test can be used) for the duration of the COVID-19 declaration under Section 564(b)(1) of the Act, 21 U.S.C. section 360bbb-3(b)(1), unless the authorization is terminated or revoked.  Performed  at [facility], [address] [place], [place] [address]      Time coordinating discharge: 32 min   SIGNED: Lorane Poland, DO [facility] 06/12/2024, 1:36 PM Pager   If 7PM-7AM, please contact night-coverage     [1]  Allergies Allergen Reactions   Codeine  Itching    Pills   Cucumber Extract Anaphylaxis    Pickles    Diclofenac Potassium Hives   Duloxetine Rash   Hydrocodone-Acetaminophen  Anaphylaxis   Oxycodone  Itching   Pregabalin Hives and Rash   Shellfish Allergy Anaphylaxis   Erythromycin    Latex    Morphine Itching    Patient itched after administering 4 mg morphine   Other     pickles   Peanut-Containing Drug Products    Penicillins    Ketorolac Rash   Tramadol Rash   "

## 2024-06-13 LAB — CULTURE, BLOOD (ROUTINE X 2)
Culture: NO GROWTH
Culture: NO GROWTH

## 2024-06-22 ENCOUNTER — Other Ambulatory Visit: Payer: Self-pay

## 2024-07-09 ENCOUNTER — Observation Stay
Admission: EM | Admit: 2024-07-09 | Payer: Self-pay | Source: Home / Self Care | Attending: Emergency Medicine | Admitting: Emergency Medicine

## 2024-07-09 ENCOUNTER — Other Ambulatory Visit: Payer: Self-pay

## 2024-07-09 ENCOUNTER — Emergency Department: Payer: Self-pay

## 2024-07-09 DIAGNOSIS — J441 Chronic obstructive pulmonary disease with (acute) exacerbation: Principal | ICD-10-CM | POA: Diagnosis present

## 2024-07-09 DIAGNOSIS — K2960 Other gastritis without bleeding: Secondary | ICD-10-CM | POA: Diagnosis present

## 2024-07-09 DIAGNOSIS — Z720 Tobacco use: Secondary | ICD-10-CM | POA: Diagnosis present

## 2024-07-09 DIAGNOSIS — G43909 Migraine, unspecified, not intractable, without status migrainosus: Secondary | ICD-10-CM | POA: Diagnosis present

## 2024-07-09 DIAGNOSIS — J9611 Chronic respiratory failure with hypoxia: Secondary | ICD-10-CM | POA: Diagnosis present

## 2024-07-09 LAB — CBC
HCT: 40.5 % (ref 36.0–46.0)
Hemoglobin: 13.5 g/dL (ref 12.0–15.0)
MCH: 30.9 pg (ref 26.0–34.0)
MCHC: 33.3 g/dL (ref 30.0–36.0)
MCV: 92.7 fL (ref 80.0–100.0)
Platelets: 176 10*3/uL (ref 150–400)
RBC: 4.37 MIL/uL (ref 3.87–5.11)
RDW: 12.6 % (ref 11.5–15.5)
WBC: 5.2 10*3/uL (ref 4.0–10.5)
nRBC: 0 % (ref 0.0–0.2)

## 2024-07-09 LAB — BASIC METABOLIC PANEL WITH GFR
Anion gap: 9 (ref 5–15)
BUN: 13 mg/dL (ref 6–20)
CO2: 29 mmol/L (ref 22–32)
Calcium: 9.7 mg/dL (ref 8.9–10.3)
Chloride: 102 mmol/L (ref 98–111)
Creatinine, Ser: 0.58 mg/dL (ref 0.44–1.00)
GFR, Estimated: >60 mL/min
Glucose, Bld: 103 mg/dL — ABNORMAL HIGH (ref 70–99)
Potassium: 4.5 mmol/L (ref 3.5–5.1)
Sodium: 140 mmol/L (ref 135–145)

## 2024-07-09 MED ORDER — DM-GUAIFENESIN ER 30-600 MG PO TB12
1.0000 | ORAL_TABLET | Freq: Two times a day (BID) | ORAL | Status: AC | PRN
  Administered 2024-07-09: 1 via ORAL
  Filled 2024-07-09: qty 1

## 2024-07-09 MED ORDER — ONDANSETRON HCL 4 MG/2ML IJ SOLN
4.0000 mg | Freq: Three times a day (TID) | INTRAMUSCULAR | Status: AC | PRN
  Administered 2024-07-09 – 2024-07-10 (×4): 4 mg via INTRAVENOUS
  Filled 2024-07-09 (×4): qty 2

## 2024-07-09 MED ORDER — IPRATROPIUM-ALBUTEROL 0.5-2.5 (3) MG/3ML IN SOLN
3.0000 mL | Freq: Once | RESPIRATORY_TRACT | Status: AC
  Administered 2024-07-09: 3 mL via RESPIRATORY_TRACT
  Filled 2024-07-09: qty 3

## 2024-07-09 MED ORDER — METHYLPREDNISOLONE SODIUM SUCC 125 MG IJ SOLR
125.0000 mg | Freq: Once | INTRAMUSCULAR | Status: AC
  Administered 2024-07-09: 125 mg via INTRAVENOUS
  Filled 2024-07-09: qty 2

## 2024-07-09 MED ORDER — OXYCODONE-ACETAMINOPHEN 5-325 MG PO TABS
1.0000 | ORAL_TABLET | Freq: Four times a day (QID) | ORAL | Status: AC | PRN
  Administered 2024-07-09 – 2024-07-10 (×4): 1 via ORAL
  Filled 2024-07-09 (×4): qty 1

## 2024-07-09 MED ORDER — ENOXAPARIN SODIUM 40 MG/0.4ML IJ SOSY
40.0000 mg | PREFILLED_SYRINGE | INTRAMUSCULAR | Status: AC
  Administered 2024-07-09 – 2024-07-10 (×2): 40 mg via SUBCUTANEOUS
  Filled 2024-07-09 (×2): qty 0.4

## 2024-07-09 MED ORDER — ALBUTEROL SULFATE (2.5 MG/3ML) 0.083% IN NEBU
2.5000 mg | INHALATION_SOLUTION | RESPIRATORY_TRACT | Status: AC | PRN
  Administered 2024-07-10: 2.5 mg via RESPIRATORY_TRACT
  Filled 2024-07-09: qty 3

## 2024-07-09 MED ORDER — PANTOPRAZOLE SODIUM 40 MG PO TBEC
40.0000 mg | DELAYED_RELEASE_TABLET | Freq: Two times a day (BID) | ORAL | Status: AC
  Administered 2024-07-09 – 2024-07-10 (×3): 40 mg via ORAL
  Filled 2024-07-09 (×2): qty 1

## 2024-07-09 MED ORDER — NICOTINE 21 MG/24HR TD PT24
21.0000 mg | MEDICATED_PATCH | Freq: Every day | TRANSDERMAL | Status: AC
  Administered 2024-07-09 – 2024-07-10 (×2): 21 mg via TRANSDERMAL
  Filled 2024-07-09 (×2): qty 1

## 2024-07-09 MED ORDER — ALBUTEROL SULFATE HFA 108 (90 BASE) MCG/ACT IN AERS: 2.0000 | INHALATION_SPRAY | RESPIRATORY_TRACT | Status: DC | PRN

## 2024-07-09 MED ORDER — ACETAMINOPHEN 325 MG PO TABS
650.0000 mg | ORAL_TABLET | Freq: Four times a day (QID) | ORAL | Status: AC | PRN
  Administered 2024-07-10: 650 mg via ORAL
  Filled 2024-07-09: qty 2

## 2024-07-09 MED ORDER — IPRATROPIUM-ALBUTEROL 0.5-2.5 (3) MG/3ML IN SOLN
3.0000 mL | RESPIRATORY_TRACT | Status: AC
  Administered 2024-07-09 – 2024-07-10 (×7): 3 mL via RESPIRATORY_TRACT
  Filled 2024-07-09 (×7): qty 3

## 2024-07-09 MED ORDER — SUMATRIPTAN SUCCINATE 50 MG PO TABS
50.0000 mg | ORAL_TABLET | Freq: Every day | ORAL | Status: AC | PRN
  Filled 2024-07-09: qty 1

## 2024-07-09 MED ORDER — MONTELUKAST SODIUM 10 MG PO TABS
10.0000 mg | ORAL_TABLET | Freq: Every day | ORAL | Status: AC
  Administered 2024-07-09 – 2024-07-10 (×2): 10 mg via ORAL
  Filled 2024-07-09 (×2): qty 1

## 2024-07-09 MED ORDER — METHYLPREDNISOLONE SODIUM SUCC 125 MG IJ SOLR
80.0000 mg | Freq: Every day | INTRAMUSCULAR | Status: AC
  Administered 2024-07-10: 80 mg via INTRAVENOUS
  Filled 2024-07-09: qty 2

## 2024-07-09 NOTE — ED Triage Notes (Signed)
 Pt BIB [facility] from home for 3 days of [facility]OB. H/x COPD, Lupus, Asthma. Pt on 4L o2 at baseline. Pt took 4 albuterol  nebs at home and then fire administered 1 albuterol  neb. Some chest tightness.  133/90 108HR 100% 4L 98CBG

## 2024-07-09 NOTE — ED Provider Notes (Signed)
 "  [facility] Provider Note    Event Date/Time   First MD Initiated Contact with Patient 07/09/24 1822     (approximate)  History   Chief Complaint: Shortness of Breath  HPI  Grace King is a 54 y.o. female with a past medical history of asthma, COPD, lupus, presents to the emergency department for worsening shortness of breath.  According to the patient for the past 1 week or so she has been experiencing shortness of breath.  She saw her doctor for the same who wrote her for prednisone  as well as doxycycline .  Patient states yesterday was the last day of both of those medications and today she has continued to feel very short of breath.  Patient has been using her nebulizer machine at home without any relief.  Patient presents to the emergency department today for worsening shortness of breath.  Patient does wear 4 L chronically at home.  Physical Exam   Triage Vital Signs: ED Triage Vitals  Encounter Vitals Group     BP 07/09/24 1554 (!) 140/86     Girls Systolic BP Percentile --      Girls Diastolic BP Percentile --      Boys Systolic BP Percentile --      Boys Diastolic BP Percentile --      Pulse Rate 07/09/24 1554 98     Resp 07/09/24 1554 20     Temp 07/09/24 1554 98.5 F (36.9 C)     Temp Source 07/09/24 1554 Oral     SpO2 07/09/24 1554 97 %     Weight 07/09/24 1552 136 lb (61.7 kg)     Height 07/09/24 1552 5' 5 (1.651 m)     Head Circumference --      Peak Flow --      Pain Score 07/09/24 1552 0     Pain Loc --      Pain Education --      Exclude from Growth Chart --     Most recent vital signs: Vitals:   07/09/24 1554 07/09/24 1830  BP: (!) 140/86 128/82  Pulse: 98 (!) 101  Resp: 20 20  Temp: 98.5 F (36.9 C)   SpO2: 97% 100%    General: Awake answering questions appropriately.  Tachypneic but no significant distress. CV:  Good peripheral perfusion.  Regular rate and rhythm  Resp:  Tachypneic around 25 breaths/min.   Diminished breath sounds bilaterally with mild expiratory wheeze.  No rales or rhonchi. Abd:  No distention.  Soft, nontender.  No rebound or guarding.  ED Results / Procedures / Treatments   EKG  EKG viewed and interpreted by myself shows sinus tachycardia 107 bpm the narrow QRS, normal axis, normal intervals, no concerning ST changes.  RADIOLOGY  I have reviewed and interpreted the chest x-ray images.  No obvious consolidation on my evaluation. Radiology has read the x-ray as emphysema without acute findings.   MEDICATIONS ORDERED IN ED: Medications  ipratropium-albuterol  (DUONEB) 0.5-2.5 (3) MG/3ML nebulizer solution 3 mL (3 mLs Nebulization Given 07/09/24 1827)  ipratropium-albuterol  (DUONEB) 0.5-2.5 (3) MG/3ML nebulizer solution 3 mL (3 mLs Nebulization Given 07/09/24 1827)  methylPREDNISolone  sodium succinate (SOLU-MEDROL ) 125 mg/2 mL injection 125 mg (125 mg Intravenous Given 07/09/24 1850)     IMPRESSION / MDM / ASSESSMENT AND PLAN / ED COURSE  I reviewed the triage vital signs and the nursing notes.  Patient's presentation is most consistent with acute presentation with potential threat to life or  bodily function.  Patient presents emergency department for worsening shortness of breath.  Patient has a history of COPD wears 4 L chronically.  Patient has taken doxycycline  and prednisone  at home just finished these medications yesterday per patient.  Patient states she has continued to worsen despite taking these medications.  Patient is tachypneic around 25 breaths/min has diminished breath sounds bilaterally.  Symptoms consistent with COPD given 2 additional DuoNebs in the emergency department still no improvement.  Patient dose IV Solu-Medrol .  Patient's lab work shows a reassuring CBC reassuring chemistry chest x-ray is clear and EKG reassuring.  Given the patient's increased work of breathing with failure of outpatient therapy we will admit to the hospital service for ongoing workup  and treatment of her COPD exacerbation.  FINAL CLINICAL IMPRESSION(S) / ED DIAGNOSES   Dyspnea COPD exacerbation   Note:  This document was prepared using Dragon voice recognition software and may include unintentional dictation errors.   Dorothyann Drivers, MD 07/09/24 [phone]  "

## 2024-07-09 NOTE — H&P (Incomplete)
 " History and Physical    Paityn S Mccutchan FMW:[id number] DOB: [birth year] DOA: 07/09/2024  Referring MD/NP/PA:   PCP: Nathen Dess, MD   Patient coming from:  The patient is coming from home.     Chief Complaint: SOB  HPI: JUDIA ARNOTT is a 54 y.o. female with medical history significant of COPD on 4 L of oxygen , smoker, erosive gastritis, migraine, vitamin B12 deficiency, lupus, multiple allergies, chronic back pain on Percocet, who presents with SOB.  Patient was recently hospitalized from 1/5 - 1/9 due to COPD exacerbation and required BiPAP.  Patient states that her SOB has been progressively worsening in the past 10 days.  She completed a course of doxycycline  and prednisone  prescribed by PCP, without significant improvement.  Patient has cough with clear mucus production.  Has chest tightness, no active chest pain.  No fever or chills.  She has nausea, no vomiting, diarrhea or abdominal pain.  Patient has chronic back pain.  Data reviewed independently and ED Course: pt was found to have WBC 5.2, GFR> 60.  Temperature normal, blood pressure 128/82, heart rate 101, RR 20, oxygen  saturation 100% on home level 42 oxygen .  Chest x-ray showed COPD without infiltration.  Patient is placed in telemetry bed for observation.  EKG: I have personally reviewed.  Sinus rhythm, QTc 453, LAE, poor R wave pression.   Review of Systems:   General: no fevers, chills, no body weight gain, has fatigue HEENT: no blurry vision, hearing changes or sore throat Respiratory: has dyspnea, coughing, wheezing CV: no chest pain, no palpitations GI: has nausea, no vomiting, abdominal pain, diarrhea, constipation GU: no dysuria, burning on urination, increased urinary frequency, hematuria  Ext: no leg edema Neuro: no unilateral weakness, numbness, or tingling, no vision change or hearing loss Skin: no rash, no skin tear. MSK: No muscle spasm, no deformity, no limitation of range of movement in spin Heme: No  easy bruising.  Travel history: No recent long distant travel.   Allergy: Allergies[1]  Past Medical History:  Diagnosis Date   Asthma    COPD (chronic obstructive pulmonary disease) (HCC)    Lupus     Past Surgical History:  Procedure Laterality Date   ABDOMINAL HYSTERECTOMY     BACK SURGERY      Social History:  reports that she has been smoking cigarettes. She has never used smokeless tobacco. She reports that she does not drink alcohol and does not use drugs.  Family History:  Family History  Problem Relation Age of Onset   Breast cancer Mother    Heart disease Father      Prior to Admission medications  Medication Sig Start Date End Date Taking? Authorizing Provider  albuterol  (PROVENTIL ) (2.5 MG/3ML) 0.083% nebulizer solution Take 3 mLs (2.5 mg total) by nebulization every 4 (four) hours as needed for wheezing or shortness of breath. 10/28/23 10/27/24  Willo Dunnings, MD  albuterol  (VENTOLIN  HFA) 108 (90 Base) MCG/ACT inhaler Inhale 2 puffs into the lungs every 6 (six) hours as needed for wheezing. 04/07/24   [provider]  budesonide -formoterol  (SYMBICORT) 160-4.5 MCG/ACT inhaler Inhale 2 puffs into the lungs 2 (two) times daily.    [provider]  Cholecalciferol 50 MCG (2000 UT) TABS Take 4,000 Units by mouth daily. 01/25/22   [provider]  cyanocobalamin  (VITAMIN B12) 1000 MCG/ML injection Inject 1,000 mcg into the muscle once a week. 08/09/22   [provider]  dextromethorphan  (DELSYM ) 30 MG/5ML liquid Take 2.5 mLs (15  mg total) by mouth 2 (two) times daily. 07/11/21   Samtani, Jai-Gurmukh, MD  EPINEPHrine  0.3 mg/0.3 mL IJ SOAJ injection Inject 0.3 mLs (0.3 mg total) into the muscle as needed for anaphylaxis. 11/14/19   Edelmiro Leash, MD  guaiFENesin  (MUCINEX ) 600 MG 12 hr tablet Take 1 tablet (600 mg total) by mouth 2 (two) times daily as needed for cough or to loosen phlegm. 07/11/21   Samtani, Jai-Gurmukh, MD  INCRUSE ELLIPTA   62.5 MCG/ACT AEPB Inhale 1 puff into the lungs daily.    [provider]  montelukast  (SINGULAIR ) 10 MG tablet Take 10 mg by mouth at bedtime.     [provider]  oxyCODONE -acetaminophen  (PERCOCET/ROXICET) 5-325 MG tablet Take 1 tablet by mouth every 6 (six) hours as needed for severe pain.    [provider]  pantoprazole  (PROTONIX ) 40 MG tablet Take 40 mg by mouth 2 (two) times daily.    [provider]  SUMAtriptan  (IMITREX ) 50 MG tablet Take 50 mg by mouth daily as needed for migraine. (May repeat after 2 hours if needed)    [provider]  tiotropium (SPIRIVA  HANDIHALER) 18 MCG inhalation capsule Place 18 mcg into inhaler and inhale daily.    [provider]  tiZANidine  (ZANAFLEX ) 4 MG tablet Take 4 mg by mouth at bedtime as needed. 08/09/23   [provider]  traZODone  (DESYREL ) 100 MG tablet Take 100 mg by mouth at bedtime. Patient not taking: Reported on 06/09/2024    [provider]    Physical Exam: Vitals:   07/09/24 1552 07/09/24 1554 07/09/24 1830 07/09/24 2151  BP:  (!) 140/86 128/82   Pulse:  98 (!) 101   Resp:  20 20   Temp:  98.5 F (36.9 C)  98.2 F (36.8 C)  TempSrc:  Oral    SpO2:  97% 100%   Weight: 61.7 kg     Height: 5' 5 (1.651 m)      General: has moderate acute respiratory distress HEENT:       Eyes: PERRL, EOMI, no jaundice       ENT: No discharge from the ears and nose, no pharynx injection, no tonsillar enlargement.        Neck: No JVD, no bruit, no mass felt. Heme: No neck lymph node enlargement. Cardiac: S1/S2, RRR, No murmurs, No gallops or rubs. Respiratory: Has severely decreased air movement bilaterally, with minimal wheezing bilaterally  GI: Soft, nondistended, nontender, no rebound pain, no organomegaly, BS present. GU: No hematuria Ext: No pitting leg edema bilaterally. 1+DP/PT pulse bilaterally. Musculoskeletal: No joint deformities, No joint redness or warmth, no  limitation of ROM in spin. Skin: No rashes.  Neuro: Alert, oriented X3, cranial nerves II-XII grossly intact, moves all extremities normally.  Psych: Patient is not psychotic, no suicidal or hemocidal ideation.  Labs on Admission: I have personally reviewed following labs and imaging studies  CBC: Recent Labs  Lab 07/09/24 1556  WBC 5.2  HGB 13.5  HCT 40.5  MCV 92.7  PLT 176   Basic Metabolic Panel: Recent Labs  Lab 07/09/24 1556  NA 140  K 4.5  CL 102  CO2 29  GLUCOSE 103*  BUN 13  CREATININE 0.58  CALCIUM 9.7   GFR: Estimated Creatinine Clearance: 74.9 mL/min (by C-G formula based on SCr of 0.58 mg/dL). Liver Function Tests: No results for input(s): AST, ALT, ALKPHOS, BILITOT, PROT, ALBUMIN in the last 168 hours. No results for input(s): LIPASE, AMYLASE in the last 168  hours. No results for input(s): AMMONIA in the last 168 hours. Coagulation Profile: No results for input(s): INR, PROTIME in the last 168 hours. Cardiac Enzymes: No results for input(s): CKTOTAL, CKMB, CKMBINDEX, TROPONINI in the last 168 hours. BNP (last 3 results) No results for input(s): PROBNP in the last 8760 hours. HbA1C: No results for input(s): HGBA1C in the last 72 hours. CBG: No results for input(s): GLUCAP in the last 168 hours. Lipid Profile: No results for input(s): CHOL, HDL, LDLCALC, TRIG, CHOLHDL, LDLDIRECT in the last 72 hours. Thyroid  Function Tests: No results for input(s): TSH, T4TOTAL, FREET4, T3FREE, THYROIDAB in the last 72 hours. Anemia Panel: No results for input(s): VITAMINB12, FOLATE, FERRITIN, TIBC, IRON, RETICCTPCT in the last 72 hours. Urine analysis:    Component Value Date/Time   COLORURINE YELLOW (A) 09/08/2022 1823   APPEARANCEUR HAZY (A) 09/08/2022 1823   LABSPEC 1.019 09/08/2022 1823   PHURINE 5.0 09/08/2022 1823   GLUCOSEU NEGATIVE 09/08/2022 1823   HGBUR NEGATIVE 09/08/2022 1823    BILIRUBINUR NEGATIVE 09/08/2022 1823   KETONESUR 80 (A) 09/08/2022 1823   PROTEINUR NEGATIVE 09/08/2022 1823   NITRITE NEGATIVE 09/08/2022 1823   LEUKOCYTESUR NEGATIVE 09/08/2022 1823   Sepsis Labs: @LABRCNTIP (procalcitonin:4,lacticidven:4) )No results found for this or any previous visit (from the past 240 hours).   Radiological Exams on Admission:   Assessment/Plan Principal Problem:   COPD exacerbation (HCC) Active Problems:   Chronic respiratory failure with hypoxia (HCC)   Erosive gastritis   Migraine   Tobacco abuse   Assessment and Plan:  COPD exacerbation and chronic respiratory failure with hypoxia [facility]): Patient has severely decreased air movement with minimal wheezing on auscultation.  Chest x-ray showed COPD without infiltration.  She has worsening SOB with moderate acute respiratory distress, but still on home level 4L oxygen  with 100% of saturation now.  -Place in tele bed for obs -Bronchodilators and prn Mucinex  -Singulair  -Solu-Medrol  80 mg IV daily after given 125 mg of Solu-Medrol  -Incentive spirometry -check RVP -Follow up sputum culture  Erosive gastritis - Protonix   Migraine -As needed sumatriptan   Tobacco abuse: Patient states that she stopped smoking long time ago, but still needs nicotine  patch - Nicotine  patch - Due to counseling about importance of not smoking again      DVT ppx: SQ Lovenox   Code Status: Full code   Family Communication:     not done, no family member is at bed side.     Disposition Plan:  Anticipate discharge back to previous environment  Consults called:  none  Admission status and Level of care: Telemetry:    for obs    Dispo: The patient is from: Home              Anticipated d/c is to: Home              Anticipated d/c date is: 1 day              Patient currently is not medically stable to d/c.    Severity of Illness:  The appropriate patient status for this patient is OBSERVATION. Observation  status is judged to be reasonable and necessary in order to provide the required intensity of service to ensure the patient's safety. The patient's presenting symptoms, physical exam findings, and initial radiographic and laboratory data in the context of their medical condition is felt to place them at decreased risk for further clinical deterioration. Furthermore, it is anticipated that the patient will be medically stable for discharge  from the hospital within 2 midnights of admission.        Date of Service 07/10/2024    Caleb Exon [facility]   If 7PM-7AM, please contact night-coverage [email or web address] 07/10/2024, 12:26 AM       [1]  Allergies Allergen Reactions   Codeine  Itching    Pills   Cucumber Extract Anaphylaxis    Pickles    Diclofenac Potassium Hives   Duloxetine Rash   Hydrocodone-Acetaminophen  Anaphylaxis   Oxycodone  Itching   Pregabalin Hives and Rash   Shellfish Allergy Anaphylaxis   Erythromycin    Latex    Morphine Itching    Patient itched after administering 4 mg morphine   Other     pickles   Peanut-Containing Drug Products    Penicillins    Ketorolac Rash   Tramadol Rash   "

## 2024-07-10 ENCOUNTER — Other Ambulatory Visit (HOSPITAL_COMMUNITY): Payer: Self-pay

## 2024-07-10 ENCOUNTER — Encounter: Payer: Self-pay | Admitting: Internal Medicine

## 2024-07-10 DIAGNOSIS — Z720 Tobacco use: Secondary | ICD-10-CM | POA: Diagnosis present

## 2024-07-10 LAB — RESPIRATORY PANEL BY PCR

## 2024-07-10 LAB — CBC
HCT: 39.7 % (ref 36.0–46.0)
Hemoglobin: 12.9 g/dL (ref 12.0–15.0)
MCH: 30.7 pg (ref 26.0–34.0)
MCHC: 32.5 g/dL (ref 30.0–36.0)
MCV: 94.5 fL (ref 80.0–100.0)
Platelets: 162 10*3/uL (ref 150–400)
RBC: 4.2 MIL/uL (ref 3.87–5.11)
RDW: 12.6 % (ref 11.5–15.5)
WBC: 3.1 10*3/uL — ABNORMAL LOW (ref 4.0–10.5)
nRBC: 0 % (ref 0.0–0.2)

## 2024-07-10 LAB — BASIC METABOLIC PANEL WITH GFR
Anion gap: 12 (ref 5–15)
BUN: 16 mg/dL (ref 6–20)
CO2: 26 mmol/L (ref 22–32)
Calcium: 9.5 mg/dL (ref 8.9–10.3)
Chloride: 100 mmol/L (ref 98–111)
Creatinine, Ser: 0.65 mg/dL (ref 0.44–1.00)
GFR, Estimated: >60 mL/min
Glucose, Bld: 194 mg/dL — ABNORMAL HIGH (ref 70–99)
Potassium: 4.7 mmol/L (ref 3.5–5.1)
Sodium: 137 mmol/L (ref 135–145)

## 2024-07-10 MED ORDER — TIZANIDINE HCL 2 MG PO TABS: 4.0000 mg | ORAL_TABLET | Freq: Every evening | ORAL | Status: AC | PRN

## 2024-07-10 MED ORDER — TRAZODONE HCL 100 MG PO TABS: 100.0000 mg | ORAL_TABLET | Freq: Every evening | ORAL | Status: AC | PRN

## 2024-07-10 MED ORDER — ALUM & MAG HYDROXIDE-SIMETH 200-200-20 MG/5ML PO SUSP
30.0000 mL | Freq: Four times a day (QID) | ORAL | Status: AC | PRN
  Administered 2024-07-10: 30 mL via ORAL
  Filled 2024-07-10: qty 30

## 2024-07-10 NOTE — Progress Notes (Signed)
" °  Progress Note   Patient: Grace King FMW:[id number] DOB: 03-14-1971 DOA: 07/09/2024     0 DOS: the patient was seen and examined on 07/10/2024   Brief hospital course: Grace King is a 54 y.o. female with medical history significant of COPD on 4 L of oxygen , smoker, erosive gastritis, migraine, vitamin B12 deficiency, lupus, multiple allergies, chronic back pain on Percocet, who presents with SOB.  He is diagnosed with a COPD exacerbation, started on scheduled bronchodilator and steroids. Patient recently was admitted 3 weeks ago with a COPD sedation, she was not able to afford long-acting inhalers at that time.   Principal Problem:   COPD exacerbation (HCC) Active Problems:   Chronic respiratory failure with hypoxia (HCC)   Erosive gastritis   Migraine   Tobacco abuse   Assessment and Plan:  COPD exacerbation and chronic respiratory failure with hypoxia [facility]):  Patient has worsening short of breath at admission, she did not have any recent upper respiratory infection.  Condition was largely caused by noncompliant with inhalers which patient has not been able to afford. At this point, we will continue steroids and scheduled bronchodilator.  Continue incentive spirometer. At discharge, will feel patient long-acting inhaler in [facility] pharmacy.   Erosive gastritis - Protonix    Migraine -As needed sumatriptan    Tobacco abuse: Patient states that she stopped smoking long time ago, but still needs nicotine  patch - Nicotine  patch - Due to counseling about importance of not smoking again     Subjective:  Patient still has significant short of breath with minimal exertion.  Symptoms started a day ago, she did not have any paroxysmal nocturnal dyspnea or orthopnea.  Physical Exam: Vitals:   07/10/24 0500 07/10/24 0648 07/10/24 0730 07/10/24 0733  BP: (!) 157/105  129/66   Pulse: 87  77   Resp: 19  19   Temp:  97.6 F (36.4 C)  97.7 F (36.5 C)  TempSrc:  Oral  Axillary   SpO2: 100%  100%   Weight:      Height:       General exam: Appears calm and comfortable  Respiratory system: Significant decreased breathing sounds without wheezes. Respiratory effort normal. Cardiovascular system: S1 & S2 heard, RRR. No JVD, murmurs, rubs, gallops or clicks. No pedal edema. Gastrointestinal system: Abdomen is nondistended, soft and nontender. No organomegaly or masses felt. Normal bowel sounds heard. Central nervous system: Alert and oriented. No focal neurological deficits. Extremities: Symmetric 5 x 5 power. Skin: No rashes, lesions or ulcers Psychiatry: Judgement and insight appear normal. Mood & affect appropriate.    Data Reviewed:  X-ray and lab results reviewed.  Family Communication: None  Disposition: Status is: Observation      Time spent: 35 minutes  Author: Murvin Mana, MD 07/10/2024 11:53 AM  For on call review [email or web address].    "

## 2024-07-10 NOTE — Hospital Course (Signed)
 Grace King is a 55 y.o. female with medical history significant of COPD on 4 L of oxygen , smoker, erosive gastritis, migraine, vitamin B12 deficiency, lupus, multiple allergies, chronic back pain on Percocet, who presents with SOB.  He is diagnosed with a COPD exacerbation, started on scheduled bronchodilator and steroids. Patient recently was admitted 3 weeks ago with a COPD sedation, she was not able to afford long-acting inhalers at that time.
